# Patient Record
Sex: Male | Born: 1980 | Race: White | Hispanic: No | Marital: Single | Smoking: Never smoker
Health system: Southern US, Community
[De-identification: ages and names within clinical notes are randomized; demographics above are authoritative.]

## PROBLEM LIST (undated history)

## (undated) DIAGNOSIS — R001 Bradycardia, unspecified: Secondary | ICD-10-CM

## (undated) DIAGNOSIS — M94262 Chondromalacia, left knee: Secondary | ICD-10-CM

## (undated) DIAGNOSIS — K222 Esophageal obstruction: Secondary | ICD-10-CM

## (undated) DIAGNOSIS — M2342 Loose body in knee, left knee: Secondary | ICD-10-CM

## (undated) DIAGNOSIS — R29898 Other symptoms and signs involving the musculoskeletal system: Secondary | ICD-10-CM

## (undated) HISTORY — PX: ELBOW FRACTURE SURGERY: SHX616

## (undated) HISTORY — PX: WISDOM TOOTH EXTRACTION: SHX21

## (undated) HISTORY — PX: VASECTOMY: SHX75

---

## 1999-05-18 ENCOUNTER — Emergency Department (HOSPITAL_COMMUNITY): Admission: EM | Admit: 1999-05-18 | Discharge: 1999-05-18 | Payer: Self-pay | Admitting: Emergency Medicine

## 1999-05-18 ENCOUNTER — Encounter: Payer: Self-pay | Admitting: Emergency Medicine

## 2000-02-15 ENCOUNTER — Emergency Department (HOSPITAL_COMMUNITY): Admission: EM | Admit: 2000-02-15 | Discharge: 2000-02-15 | Payer: Self-pay | Admitting: Emergency Medicine

## 2003-03-27 ENCOUNTER — Ambulatory Visit (HOSPITAL_COMMUNITY): Admission: EM | Admit: 2003-03-27 | Discharge: 2003-03-28 | Payer: Self-pay | Admitting: Emergency Medicine

## 2004-06-19 ENCOUNTER — Inpatient Hospital Stay (HOSPITAL_COMMUNITY): Admission: EM | Admit: 2004-06-19 | Discharge: 2004-06-21 | Payer: Self-pay | Admitting: Psychiatry

## 2004-06-19 ENCOUNTER — Ambulatory Visit: Payer: Self-pay | Admitting: Psychiatry

## 2007-01-27 ENCOUNTER — Emergency Department (HOSPITAL_COMMUNITY): Admission: EM | Admit: 2007-01-27 | Discharge: 2007-01-27 | Payer: Self-pay | Admitting: Emergency Medicine

## 2010-09-22 NOTE — Op Note (Signed)
NAME:  Chad Harrison, HORNBACK NO.:  [id number]   MEDICAL RECORD NO.:  [id number]                   PATIENT TYPE:  EMS   LOCATION:  ED                                   FACILITY:  [facility]   PHYSICIAN:  Petra Kuba, M.D.                 DATE OF BIRTH:  07/15/1980   DATE OF PROCEDURE:  03/28/2003  DATE OF DISCHARGE:                                 OPERATIVE REPORT   PROCEDURE:  Esophagoscopy with food disimpaction.   INDICATION:  Probable food impaction.  Consent was signed after risks,  benefits, methods, options thoroughly discussed by the ER physicians at my  request and briefly by me prior to any sedation.   MEDICINES USED:  1. Demerol 60.  2. Versed 5.   DESCRIPTION OF PROCEDURE:  The video endoscope was inserted by direct  vision.  In the mid esophagus was a large food bolus.  We went ahead and  grabbed it with the snare, was suctioned onto the head of the scope, and the  scope then the snare and food were removed in one large piece.  We  reinserted the scope, and some liquid reflux was seen.  We did insert the  scope into the stomach and began suctioning fluid.  There was a small hiatal  hernia and a fibrous ring just above it but no significant trauma or other  abnormalities.  Unfortunately, while we were sucking the fluid, the patient  began to retch and gag, and we elected to stop the procedure at this  junction.  The scope was removed.  The patient tolerated the procedure  adequately.  There was no obvious immediate complication.   ENDOSCOPIC DIAGNOSES:  1. Food impaction, status post removal with the snare in one large piece.  2. Small to medium sized hiatal hernia with a proximal widely patent ring.  3. Stomach not seen due to increased gagging and retching while we were     suctioning fluid.   PLAN:  We will ask him to chew his food well, soft solids for a day.  Try  over-the-counter Prilosec and follow up in the office in two weeks to  decide  further work-up and plans like a possible barium swallow or proceeding with  an EGD and dilation.                                               Petra Kuba, M.D.    MEM/MEDQ  D:  03/28/2003  T:  03/28/2003  Job:  [id number]

## 2010-09-22 NOTE — Consult Note (Signed)
NAME:  Chad Harrison, Chad Harrison NO.:  [id number]   MEDICAL RECORD NO.:  [id number]                   PATIENT TYPE:  EMS   LOCATION:  ED                                   FACILITY:  [facility]   PHYSICIAN:  Petra Kuba, M.D.                 DATE OF BIRTH:  1980-11-03   DATE OF CONSULTATION:  03/28/2003  DATE OF DISCHARGE:                                   CONSULTATION   HISTORY:  The patient seen at the request of the ER physician for a food  impaction.  He has had episode of dysphagia but has always been able to get  it out.  He has not had any procedures, medicines, or work-up before.  But  tonight while eating steak, he felt it in his chest and was unable to  swallow any liquids.  ER physician did try some Glucagon which was  unsuccessful.  We are called for consideration of endoscopy.   PAST MEDICAL HISTORY:  Essentially negative.  He has had surgery on a broken  arm.   ALLERGIES:  CODEINE.   MEDICATIONS:  None.   SOCIAL HISTORY:  Does not drink or smoke.  Does not use any drugs.   FAMILY HISTORY:  Pertinent for his sister having similar swallowing problems  but no specific diagnosis.   REVIEW OF SYSTEMS:  Negative except above.   PHYSICAL EXAMINATION:  GENERAL:  No acute distress.  Spitting into the can.  LUNGS:  Clear.  HEART:  Regular rate and rhythm.  ABDOMEN:  Soft and nontender.   ASSESSMENT:  Probable food impaction.   PLAN:  The risks, benefits, methods of endoscopy were discussed by the  emergency room team, and we briefly discussed the procedure, and now are  going to it.  For further work-up and plans, please see endoscopy dictation.                                               Petra Kuba, M.D.    MEM/MEDQ  D:  03/28/2003  T:  03/28/2003  Job:  [id number]
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
## 2013-01-13 ENCOUNTER — Ambulatory Visit (INDEPENDENT_AMBULATORY_CARE_PROVIDER_SITE_OTHER): Payer: BC Managed Care – PPO | Admitting: Family Medicine

## 2013-01-13 VITALS — BP 112/60 | HR 64 | Temp 97.8°F | Resp 16 | Ht 71.5 in | Wt 170.8 lb

## 2013-01-13 DIAGNOSIS — S0990XA Unspecified injury of head, initial encounter: Secondary | ICD-10-CM

## 2013-01-13 DIAGNOSIS — D1720 Benign lipomatous neoplasm of skin and subcutaneous tissue of unspecified limb: Secondary | ICD-10-CM

## 2013-01-13 DIAGNOSIS — R55 Syncope and collapse: Secondary | ICD-10-CM

## 2013-01-13 NOTE — Patient Instructions (Addendum)
Take it easy for the next couple of days.  Rest and sleep as much as you can.  Avoid "screen time," reading and exercise.  If you are feeling well you can return to work on Thursday.  However, if you are not back to baseline by Thursday you may need more time.  Let us know if you need a change to your work note.  If you have vomiting, severe headache or any other symptoms that concern you please call us, come back in or go to the nearest ER.    At some point let's plan to check your blood count and blood sugar
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
## 2013-01-14 ENCOUNTER — Other Ambulatory Visit: Payer: Self-pay | Admitting: Radiology

## 2013-01-14 DIAGNOSIS — D1720 Benign lipomatous neoplasm of skin and subcutaneous tissue of unspecified limb: Secondary | ICD-10-CM

## 2013-01-19 ENCOUNTER — Ambulatory Visit
Admission: RE | Admit: 2013-01-19 | Discharge: 2013-01-19 | Disposition: A | Discharge status: Home / Self Care | Payer: BC Managed Care – PPO | Source: Ambulatory Visit | Attending: Family Medicine | Admitting: Family Medicine

## 2013-01-19 DIAGNOSIS — D1720 Benign lipomatous neoplasm of skin and subcutaneous tissue of unspecified limb: Secondary | ICD-10-CM

## 2013-01-20 ENCOUNTER — Telehealth: Payer: Self-pay | Admitting: Family Medicine

## 2013-01-20 NOTE — Telephone Encounter (Signed)
Called to give ultrasound results.  He has no further concerns.  Will plan to come in to go over some unrelated questions.

## 2013-06-29 ENCOUNTER — Emergency Department (HOSPITAL_COMMUNITY)
Admission: EM | Admit: 2013-06-29 | Discharge: 2013-06-30 | Disposition: A | Discharge status: Home / Self Care | Payer: BC Managed Care – PPO | Attending: Emergency Medicine | Admitting: Emergency Medicine

## 2013-06-29 ENCOUNTER — Encounter (HOSPITAL_COMMUNITY): Payer: Self-pay | Admitting: Emergency Medicine

## 2013-06-29 ENCOUNTER — Emergency Department (HOSPITAL_COMMUNITY): Payer: BC Managed Care – PPO

## 2013-06-29 DIAGNOSIS — Z8659 Personal history of other mental and behavioral disorders: Secondary | ICD-10-CM | POA: Insufficient documentation

## 2013-06-29 DIAGNOSIS — N451 Epididymitis: Secondary | ICD-10-CM

## 2013-06-29 DIAGNOSIS — Z8619 Personal history of other infectious and parasitic diseases: Secondary | ICD-10-CM | POA: Insufficient documentation

## 2013-06-29 DIAGNOSIS — N453 Epididymo-orchitis: Secondary | ICD-10-CM | POA: Insufficient documentation

## 2013-06-29 LAB — URINALYSIS, ROUTINE W REFLEX MICROSCOPIC
Bilirubin Urine: NEGATIVE
Glucose, UA: NEGATIVE mg/dL
Hgb urine dipstick: NEGATIVE
Ketones, ur: NEGATIVE mg/dL
Leukocytes, UA: NEGATIVE
Nitrite: NEGATIVE
Protein, ur: NEGATIVE mg/dL
Specific Gravity, Urine: 1.025 (ref 1.005–1.030)
Urobilinogen, UA: 0.2 mg/dL (ref 0.0–1.0)
pH: 5.5 (ref 5.0–8.0)

## 2013-06-29 NOTE — ED Notes (Signed)
Presents with right testicular pain described as intermittent and "twisting pain" pain radiates into abdomen as well. denies testicular swelling. Pain began at 6:30 this AM while getting into work truck. Denies injury to area.  Denies penile drainage. Denies sexual intercourse for "a long time"  Sent from [facility] to R/O torsion.

## 2013-06-29 NOTE — ED Notes (Signed)
Patient transported to Ultrasound 

## 2013-06-29 NOTE — ED Notes (Signed)
Patient returned from ultrasound at this time.

## 2013-06-29 NOTE — ED Provider Notes (Signed)
CSN: [id number]     Arrival date & time 06/29/13  2009 History   First MD Initiated Contact with Patient 06/29/13 2111     Chief Complaint  Patient presents with  . Testicle Pain     (Consider location/radiation/quality/duration/timing/severity/associated sxs/prior Treatment) Patient is a 33 y.o. male presenting with testicular pain. The history is provided by the patient.  Testicle Pain  He had onset this morning of a dull pain in the lower abdomen without any particular lateralization. Pain waxed and waned but became constant and settled in the right testicle sometime between 10 AM and 11 AM. Pain is moderate in intensity and is rated at 6/10. It is worse when standing if the testicle was not supported. Nothing makes it feel any better other than elevating the testicle. He denies any dysuria and denies any urethral discharge but does state that he was treated for chlamydia several weeks ago. He is status post vasectomy. He went to an urgent care Center where he was told to come to the ED to rule out testicular torsion.  Past Medical History  Diagnosis Date  . Depression    Past Surgical History  Procedure Laterality Date  . Elbow surgery  1986   Family History  Problem Relation Age of Onset  . Diabetes Mother   . Multiple sclerosis Father   . Anxiety disorder Sister   . Arthritis Maternal Grandmother   . Heart disease Paternal Grandfather    History  Substance Use Topics  . Smoking status: Never Smoker   . Smokeless tobacco: Not on file  . Alcohol Use: No    Review of Systems  Genitourinary: Positive for testicular pain.  All other systems reviewed and are negative.      Allergies  Wellbutrin and Codeine  Home Medications  No current outpatient prescriptions on file. BP 139/85  Pulse 66  Temp(Src) 98.2 F (36.8 C) (Oral)  Ht 5' 11.75" (1.822 m)  Wt 172 lb 4 oz (78.132 kg)  BMI 23.54 kg/m2  SpO2 100% Physical Exam  Nursing note and vitals reviewed.  33  year old male, resting comfortably and in no acute distress. Vital signs are normal. Oxygen saturation is 100%, which is normal. Head is normocephalic and atraumatic. PERRLA, EOMI. Oropharynx is clear. Neck is nontender and supple without adenopathy or JVD. Back is nontender and there is no CVA tenderness. Lungs are clear without rales, wheezes, or rhonchi. Chest is nontender. Heart has regular rate and rhythm without murmur. Abdomen is soft, flat, nontender without masses or hepatosplenomegaly and peristalsis is normoactive. Genitalia: Circumcised penis, testes descended without masses. Right testicle is slightly enlarged compared with the left. There is tenderness over the right epididymis but not in the right testicle. There is no tenderness in the inguinal canal. There is no inguinal adenopathy. Extremities have no cyanosis or edema, full range of motion is present. Skin is warm and dry without rash. Neurologic: Mental status is normal, cranial nerves are intact, there are no motor or sensory deficits.  ED Course  Procedures (including critical care time) Labs Review Results for orders placed during the hospital encounter of 06/29/13  URINALYSIS, ROUTINE W REFLEX MICROSCOPIC      Result Value Ref Range   Color, Urine YELLOW  YELLOW   APPearance CLEAR  CLEAR   Specific Gravity, Urine 1.025  1.005 - 1.030   pH 5.5  5.0 - 8.0   Glucose, UA NEGATIVE  NEGATIVE mg/dL   Hgb urine dipstick NEGATIVE  NEGATIVE  Bilirubin Urine NEGATIVE  NEGATIVE   Ketones, ur NEGATIVE  NEGATIVE mg/dL   Protein, ur NEGATIVE  NEGATIVE mg/dL   Urobilinogen, UA 0.2  0.0 - 1.0 mg/dL   Nitrite NEGATIVE  NEGATIVE   Leukocytes, UA NEGATIVE  NEGATIVE   Imaging Review US Scrotum  06/29/2013   CLINICAL DATA:  Right testicular pain.  EXAM: SCROTAL ULTRASOUND  DOPPLER ULTRASOUND OF THE TESTICLES  TECHNIQUE: Complete ultrasound examination of the testicles, epididymis, and other scrotal structures was performed. Color  and spectral Doppler ultrasound were also utilized to evaluate blood flow to the testicles.  COMPARISON:  None.  FINDINGS: Right testicle  Measurements: 4.0 x 2.6 x 4.0 cm. No mass or microlithiasis visualized.  Left testicle  Measurements: 4.7 x 3.0 x 3.3 cm. No mass or microlithiasis visualized.  Right epididymis: Enlarged, particularly at the inferior aspect of the epididymis, with mildly increased blood flow; the epididymis corresponds to the patient's site of pain, along the inferior aspect of the scrotum.  Left epididymis:  Normal in size and appearance.  Hydrocele:  None visualized.  Varicocele:  None visualized.  Pulsed Doppler interrogation of both testes demonstrates low resistance arterial and venous waveforms bilaterally. Mildly increased blood flow was noted with respect to the right testis.  IMPRESSION: Enlargement of the inferior aspect of the epididymis, with associated hyperemia and focal pain; mild hyperemia noted with respect to the right testis. Findings are compatible with right-sided epididymitis and mild orchitis. No evidence for testicular torsion.   Electronically Signed   By: Garald Balding M.D.   On: 06/29/2013 23:31   [place] Art/ven Flow Abd Pelv Doppler  06/29/2013   CLINICAL DATA:  Right testicular pain.  EXAM: SCROTAL ULTRASOUND  DOPPLER ULTRASOUND OF THE TESTICLES  TECHNIQUE: Complete ultrasound examination of the testicles, epididymis, and other scrotal structures was performed. Color and spectral Doppler ultrasound were also utilized to evaluate blood flow to the testicles.  COMPARISON:  None.  FINDINGS: Right testicle  Measurements: 4.0 x 2.6 x 4.0 cm. No mass or microlithiasis visualized.  Left testicle  Measurements: 4.7 x 3.0 x 3.3 cm. No mass or microlithiasis visualized.  Right epididymis: Enlarged, particularly at the inferior aspect of the epididymis, with mildly increased blood flow; the epididymis corresponds to the patient's site of pain, along the inferior aspect of the  scrotum.  Left epididymis:  Normal in size and appearance.  Hydrocele:  None visualized.  Varicocele:  None visualized.  Pulsed Doppler interrogation of both testes demonstrates low resistance arterial and venous waveforms bilaterally. Mildly increased blood flow was noted with respect to the right testis.  IMPRESSION: Enlargement of the inferior aspect of the epididymis, with associated hyperemia and focal pain; mild hyperemia noted with respect to the right testis. Findings are compatible with right-sided epididymitis and mild orchitis. No evidence for testicular torsion.   Electronically Signed   By: Garald Balding M.D.   On: 06/29/2013 23:31    MDM   Final diagnoses:  Epididymitis, right    Right testicular pain which seems most likely to be epididymitis. He did have a recent episode of Chlamydia. He'll be sent for ultrasound to rule out testicular torsion.  Ultrasound is consistent with epididymitis, no evidence of torsion. He is discharged with a prescription for doxycycline.  Delora Fuel, MD 13/24/40 1027

## 2013-06-30 MED ORDER — DOXYCYCLINE HYCLATE 100 MG PO TABS
100.0000 mg | ORAL_TABLET | Freq: Once | ORAL | Status: AC
  Administered 2013-06-30: 100 mg via ORAL
  Filled 2013-06-30: qty 1

## 2013-06-30 MED ORDER — DOXYCYCLINE HYCLATE 100 MG PO CAPS: 100.0000 mg | ORAL_CAPSULE | Freq: Two times a day (BID) | ORAL | Status: DC

## 2013-06-30 NOTE — Discharge Instructions (Signed)
Wear a jock strap as needed. Take two Naproxen (Aleve) tablets twice a day.  Epididymitis Epididymitis is a swelling (inflammation) of the epididymis. The epididymis is a cord-like structure along the back part of the testicle. Epididymitis is usually, but not always, caused by infection. This is usually a sudden problem beginning with chills, fever and pain behind the scrotum and in the testicle. There may be swelling and redness of the testicle. DIAGNOSIS  Physical examination will reveal a tender, swollen epididymis. Sometimes, cultures are obtained from the urine or from prostate secretions to help find out if there is an infection or if the cause is a different problem. Sometimes, blood work is performed to see if your white blood cell count is elevated and if a germ (bacterial) or viral infection is present. Using this knowledge, an appropriate medicine which kills germs (antibiotic) can be chosen by your caregiver. A viral infection causing epididymitis will most often go away (resolve) without treatment. HOME CARE INSTRUCTIONS   Hot sitz baths for 20 minutes, 4 times per day, may help relieve pain.  Only take over-the-counter or prescription medicines for pain, discomfort or fever as directed by your caregiver.  Take all medicines, including antibiotics, as directed. Take the antibiotics for the full prescribed length of time even if you are feeling better.  It is very important to keep all follow-up appointments. SEEK IMMEDIATE MEDICAL CARE IF:   You have a fever.  You have pain not relieved with medicines.  You have any worsening of your problems.  Your pain seems to come and go.  You develop pain, redness, and swelling in the scrotum and surrounding areas. MAKE SURE YOU:   Understand these instructions.  Will watch your condition.  Will get help right away if you are not doing well or get worse. Document Released: 04/20/2000 Document Revised: 07/16/2011 Document Reviewed:  03/10/2009 [facility] Patient Information 2014 [place], [place].  Doxycycline tablets or capsules What is this medicine? DOXYCYCLINE (dox i SYE kleen) is a tetracycline antibiotic. It kills certain bacteria or stops their growth. It is used to treat many kinds of infections, like dental, skin, respiratory, and urinary tract infections. It also treats acne, Lyme disease, malaria, and certain sexually transmitted infections. This medicine may be used for other purposes; ask your health care provider or pharmacist if you have questions. COMMON BRAND NAME(S): Adoxa CK, Adoxa Pak, Adoxa TT, Adoxa, Alodox, Avidoxy, Doxal, Monodox, Morgidox 1x Kit, Morgidox 1x, Morgidox 2x , Morgidox 2x Kit, Ocudox , Vibra-Tabs, Vibramycin What should I tell my health care provider before I take this medicine? They need to know if you have any of these conditions: -liver disease -long exposure to sunlight like working outdoors -stomach problems like colitis -an unusual or allergic reaction to doxycycline, tetracycline antibiotics, other medicines, foods, dyes, or preservatives -pregnant or trying to get pregnant -breast-feeding How should I use this medicine? Take this medicine by mouth with a full glass of water. Follow the directions on the prescription label. It is best to take this medicine without food, but if it upsets your stomach take it with food. Take your medicine at regular intervals. Do not take your medicine more often than directed. Take all of your medicine as directed even if you think you are better. Do not skip doses or stop your medicine early. Talk to your pediatrician regarding the use of this medicine in children. Special care may be needed. While this drug may be prescribed for children as young as 8  years old for selected conditions, precautions do apply. Overdosage: If you think you have taken too much of this medicine contact a poison control center or emergency room at once. NOTE: This medicine  is only for you. Do not share this medicine with others. What if I miss a dose? If you miss a dose, take it as soon as you can. If it is almost time for your next dose, take only that dose. Do not take double or extra doses. What may interact with this medicine? -antacids -barbiturates -birth control pills -bismuth subsalicylate -carbamazepine -methoxyflurane -other antibiotics -phenytoin -vitamins that contain iron -warfarin This list may not describe all possible interactions. Give your health care provider a list of all the medicines, herbs, non-prescription drugs, or dietary supplements you use. Also tell them if you smoke, drink alcohol, or use illegal drugs. Some items may interact with your medicine. What should I watch for while using this medicine? Tell your doctor or health care professional if your symptoms do not improve. Do not treat diarrhea with over the counter products. Contact your doctor if you have diarrhea that lasts more than 2 days or if it is severe and watery. Do not take this medicine just before going to bed. It may not dissolve properly when you lay down and can cause pain in your throat. Drink plenty of fluids while taking this medicine to also help reduce irritation in your throat. This medicine can make you more sensitive to the sun. Keep out of the sun. If you cannot avoid being in the sun, wear protective clothing and use sunscreen. Do not use sun lamps or tanning beds/booths. Birth control pills may not work properly while you are taking this medicine. Talk to your doctor about using an extra method of birth control. If you are being treated for a sexually transmitted infection, avoid sexual contact until you have finished your treatment. Your sexual partner may also need treatment. Avoid antacids, aluminum, calcium, magnesium, and iron products for 4 hours before and 2 hours after taking a dose of this medicine. If you are using this medicine to prevent  malaria, you should still protect yourself from contact with mosquitos. Stay in screened-in areas, use mosquito nets, keep your body covered, and use an insect repellent. What side effects may I notice from receiving this medicine? Side effects that you should report to your doctor or health care professional as soon as possible: -allergic reactions like skin rash, itching or hives, swelling of the face, lips, or tongue -difficulty breathing -fever -itching in the rectal or genital area -pain on swallowing -redness, blistering, peeling or loosening of the skin, including inside the mouth -severe stomach pain or cramps -unusual bleeding or bruising -unusually weak or tired -yellowing of the eyes or skin Side effects that usually do not require medical attention (report to your doctor or health care professional if they continue or are bothersome): -diarrhea -loss of appetite -nausea, vomiting This list may not describe all possible side effects. Call your doctor for medical advice about side effects. You may report side effects to FDA at 1-800-FDA-1088. Where should I keep my medicine? Keep out of the reach of children. Store at room temperature, below 30 degrees C (86 degrees F). Protect from light. Keep container tightly closed. Throw away any unused medicine after the expiration date. Taking this medicine after the expiration date can make you seriously ill. NOTE: This sheet is a summary. It may not cover all possible information. If you have  questions about this medicine, talk to your doctor, pharmacist, or health care provider.  2014, [employer]vier/[employer] Standard. (2007-08-12 16:53:02)

## 2015-02-05 DIAGNOSIS — M2342 Loose body in knee, left knee: Secondary | ICD-10-CM

## 2015-02-05 DIAGNOSIS — M94262 Chondromalacia, left knee: Secondary | ICD-10-CM

## 2015-02-05 HISTORY — DX: Chondromalacia, left knee: M94.262

## 2015-02-05 HISTORY — DX: Loose body in knee, left knee: M23.42

## 2015-02-11 ENCOUNTER — Other Ambulatory Visit: Payer: Self-pay | Admitting: Orthopedic Surgery

## 2015-02-14 ENCOUNTER — Encounter (HOSPITAL_BASED_OUTPATIENT_CLINIC_OR_DEPARTMENT_OTHER): Payer: Self-pay | Admitting: *Deleted

## 2015-02-15 NOTE — H&P (Signed)
Chad Harrison is an 34 y.o. male.    Chief Complaint: Left Knee Pain  HPI: Chad Harrison is here today for follow up of left knee pain.  He has been lifting a lot at work.  He still is experiencing intermittent dull pain that migrates around the medial aspect of his knee.  His pain has been going on for roughly 2-3 weeks at about 2/10.  He is experiencing burning sensation or warmth over the medial side of his knee intermittently.  He states his knee feels unstable and he has been limping on and off.  He denies swelling this pain does not wake him at night.  He feels that he is unable to run or jog due to the instability and pain.  He has been taking his Mobic every day for one week which he thinks is helping.  He denies numbness or tingling or weakness in his lower extremity.  Past Medical History  Diagnosis Date  . Loose body of left knee 02/2015  . 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 Slow heartbeat     normal for him, per pt.  . Schatzki's ring     states chokes on food easily  . Jaw clicking     Past Surgical History  Procedure Laterality Date  . Wisdom tooth extraction    . Vasectomy    . Elbow fracture surgery      as a child    Family History  Problem Relation Age of Onset  . Diabetes Mother   . Multiple sclerosis Father   . Anxiety disorder Sister   . Arthritis Maternal Grandmother   . Heart disease Paternal Grandfather    Social History:  reports that he has never smoked. He has never used smokeless tobacco. He reports that he drinks alcohol. He reports that he does not use illicit drugs.  Allergies:  Allergies  Allergen Reactions  . Wellbutrin [Bupropion] Hives and Shortness Of Breath  . Codeine Other (See Comments)    PARALYZES LEGS    No prescriptions prior to admission    No results found for this or any previous visit (from the past 48 hour(s)). No results found.  Review of Systems  Constitutional: Negative.   HENT: Negative.   Eyes: Negative.    Respiratory: Negative.   Cardiovascular: Negative.   Gastrointestinal: Negative.   Genitourinary: Negative.   Musculoskeletal: Positive for joint pain.  Skin: Negative.   Neurological: Negative.   Endo/Heme/Allergies: Negative.   Psychiatric/Behavioral: Negative.     Height 5' 11.75" (1.822 m), weight 80.287 kg (177 lb). Physical Exam  Constitutional: He is oriented to person, place, and time. He appears well-developed and well-nourished.  HENT:  Head: Normocephalic and atraumatic.  Eyes: Pupils are equal, round, and reactive to light.  Neck: Normal range of motion. Neck supple.  Cardiovascular: Intact distal pulses.   Respiratory: Effort normal.  Musculoskeletal:  The patient is left knee revealed no effusion and no ecchymosis and no deformities.  He is nontender to palpation and he has excellent range of motion roughly 0 to 130.  He has no crepitus.    Neurological: He is alert and oriented to person, place, and time.  Skin: Skin is warm and dry.  Psychiatric: He has a normal mood and affect. His behavior is normal. Judgment and thought content normal.     Assessment/Plan Assessment:   Symptomatic chondromalacia of the left knee after a work-related injury in November of 2015  Plan:  Treatment options are  discussed with the patient.  This patient is also discussed with Dr. Mayer Camel who examines the patient.  His MRI does show that he has a loose body.  He is agreed and consented to a knee arthroscopy for removal of the loose body.  The benefits risks and potential complications of surgery are discussed with the patient.  We will formally ask Worker's Comp. for approval to remove the loose body.  We anticipate seeing the patient back at time of surgical intervention.  Continue with current work status.  Call with any issues.  Keily Lepp R 02/15/2015, 12:26 PM

## 2015-02-16 ENCOUNTER — Ambulatory Visit (HOSPITAL_BASED_OUTPATIENT_CLINIC_OR_DEPARTMENT_OTHER): Payer: Worker's Compensation | Admitting: Anesthesiology

## 2015-02-16 ENCOUNTER — Ambulatory Visit (HOSPITAL_BASED_OUTPATIENT_CLINIC_OR_DEPARTMENT_OTHER)
Admission: RE | Admit: 2015-02-16 | Discharge: 2015-02-17 | Disposition: A | Discharge status: Home / Self Care | Payer: Worker's Compensation | Source: Ambulatory Visit | Attending: Orthopedic Surgery | Admitting: Orthopedic Surgery

## 2015-02-16 ENCOUNTER — Encounter (HOSPITAL_BASED_OUTPATIENT_CLINIC_OR_DEPARTMENT_OTHER)
Admission: RE | Disposition: A | Discharge status: Home / Self Care | Payer: Self-pay | Source: Ambulatory Visit | Attending: Orthopedic Surgery

## 2015-02-16 ENCOUNTER — Encounter (HOSPITAL_BASED_OUTPATIENT_CLINIC_OR_DEPARTMENT_OTHER): Payer: Self-pay | Admitting: *Deleted

## 2015-02-16 DIAGNOSIS — W228XXA Striking against or struck by other objects, initial encounter: Secondary | ICD-10-CM | POA: Diagnosis not present

## 2015-02-16 DIAGNOSIS — Y990 Civilian activity done for income or pay: Secondary | ICD-10-CM | POA: Diagnosis not present

## 2015-02-16 DIAGNOSIS — M6752 Plica syndrome, left knee: Secondary | ICD-10-CM | POA: Insufficient documentation

## 2015-02-16 DIAGNOSIS — M6750 Plica syndrome, unspecified knee: Secondary | ICD-10-CM | POA: Diagnosis present

## 2015-02-16 DIAGNOSIS — M2342 Loose body in knee, left knee: Secondary | ICD-10-CM | POA: Diagnosis present

## 2015-02-16 HISTORY — PX: KNEE ARTHROSCOPY WITH EXCISION PLICA: SHX5647

## 2015-02-16 HISTORY — DX: Bradycardia, unspecified: R00.1

## 2015-02-16 HISTORY — DX: Other symptoms and signs involving the musculoskeletal system: R29.898

## 2015-02-16 HISTORY — DX: Chondromalacia, left knee: M94.262

## 2015-02-16 HISTORY — DX: Esophageal obstruction: K22.2

## 2015-02-16 HISTORY — DX: Loose body in knee, left knee: M23.42

## 2015-02-16 SURGERY — ARTHROSCOPY, KNEE, WITH PLICA EXCISION
Anesthesia: General | Site: Knee | Laterality: Left

## 2015-02-16 MED ORDER — MIDAZOLAM HCL 2 MG/2ML IJ SOLN
1.0000 mg | INTRAMUSCULAR | Status: DC | PRN
  Administered 2015-02-16 (×2): 2 mg via INTRAVENOUS

## 2015-02-16 MED ORDER — ONDANSETRON HCL 4 MG/2ML IJ SOLN: 4.0000 mg | Freq: Four times a day (QID) | INTRAMUSCULAR | Status: DC | PRN

## 2015-02-16 MED ORDER — ONDANSETRON HCL 4 MG/2ML IJ SOLN
INTRAMUSCULAR | Status: DC | PRN
  Administered 2015-02-16: 4 mg via INTRAVENOUS

## 2015-02-16 MED ORDER — FENTANYL CITRATE (PF) 100 MCG/2ML IJ SOLN
50.0000 ug | INTRAMUSCULAR | Status: DC | PRN
  Administered 2015-02-16 (×2): 100 ug via INTRAVENOUS

## 2015-02-16 MED ORDER — SODIUM CHLORIDE 0.9 % IR SOLN
Status: DC | PRN
  Administered 2015-02-16: 2000 mL

## 2015-02-16 MED ORDER — LIDOCAINE HCL (CARDIAC) 20 MG/ML IV SOLN
INTRAVENOUS | Status: DC | PRN
  Administered 2015-02-16: 60 mg via INTRAVENOUS

## 2015-02-16 MED ORDER — FENTANYL CITRATE (PF) 100 MCG/2ML IJ SOLN
INTRAMUSCULAR | Status: AC
  Filled 2015-02-16: qty 2

## 2015-02-16 MED ORDER — ACETAMINOPHEN 325 MG PO TABS
650.0000 mg | ORAL_TABLET | Freq: Four times a day (QID) | ORAL | Status: DC
  Administered 2015-02-16 – 2015-02-17 (×2): 650 mg via ORAL
  Filled 2015-02-16 (×2): qty 2

## 2015-02-16 MED ORDER — DEXAMETHASONE SODIUM PHOSPHATE 10 MG/ML IJ SOLN
INTRAMUSCULAR | Status: AC
  Filled 2015-02-16: qty 1

## 2015-02-16 MED ORDER — ONDANSETRON HCL 4 MG/2ML IJ SOLN: 4.0000 mg | Freq: Once | INTRAMUSCULAR | Status: DC | PRN

## 2015-02-16 MED ORDER — CHLORHEXIDINE GLUCONATE 4 % EX LIQD: 60.0000 mL | Freq: Once | CUTANEOUS | Status: DC

## 2015-02-16 MED ORDER — ONDANSETRON HCL 4 MG PO TABS: 4.0000 mg | ORAL_TABLET | Freq: Four times a day (QID) | ORAL | Status: DC | PRN

## 2015-02-16 MED ORDER — TRAMADOL HCL 50 MG PO TABS: 50.0000 mg | ORAL_TABLET | Freq: Four times a day (QID) | ORAL | Status: DC | PRN

## 2015-02-16 MED ORDER — LACTATED RINGERS IV SOLN
INTRAVENOUS | Status: DC
  Administered 2015-02-16: 12:00:00 via INTRAVENOUS
  Administered 2015-02-16: 10 mL/h via INTRAVENOUS

## 2015-02-16 MED ORDER — LIDOCAINE HCL (CARDIAC) 20 MG/ML IV SOLN
INTRAVENOUS | Status: AC
  Filled 2015-02-16: qty 5

## 2015-02-16 MED ORDER — DEXAMETHASONE SODIUM PHOSPHATE 4 MG/ML IJ SOLN
INTRAMUSCULAR | Status: DC | PRN
  Administered 2015-02-16: 10 mg via INTRAVENOUS

## 2015-02-16 MED ORDER — PROPOFOL 10 MG/ML IV BOLUS
INTRAVENOUS | Status: AC
  Filled 2015-02-16: qty 20

## 2015-02-16 MED ORDER — ONDANSETRON HCL 4 MG/2ML IJ SOLN
INTRAMUSCULAR | Status: AC
  Filled 2015-02-16: qty 2

## 2015-02-16 MED ORDER — HYDROCODONE-ACETAMINOPHEN 5-325 MG PO TABS: 1.0000 | ORAL_TABLET | ORAL | Status: DC | PRN

## 2015-02-16 MED ORDER — FENTANYL CITRATE (PF) 100 MCG/2ML IJ SOLN: 25.0000 ug | INTRAMUSCULAR | Status: DC | PRN

## 2015-02-16 MED ORDER — HYDROCODONE-ACETAMINOPHEN 5-325 MG PO TABS: 1.0000 | ORAL_TABLET | Freq: Four times a day (QID) | ORAL | Status: DC | PRN

## 2015-02-16 MED ORDER — CEFAZOLIN SODIUM-DEXTROSE 2-3 GM-% IV SOLR
INTRAVENOUS | Status: AC
  Filled 2015-02-16: qty 50

## 2015-02-16 MED ORDER — METOCLOPRAMIDE HCL 5 MG PO TABS: 5.0000 mg | ORAL_TABLET | Freq: Three times a day (TID) | ORAL | Status: DC | PRN

## 2015-02-16 MED ORDER — MORPHINE SULFATE (PF) 2 MG/ML IV SOLN: 0.5000 mg | INTRAVENOUS | Status: DC | PRN

## 2015-02-16 MED ORDER — PROPOFOL 10 MG/ML IV BOLUS
INTRAVENOUS | Status: DC | PRN
  Administered 2015-02-16: 200 mg via INTRAVENOUS

## 2015-02-16 MED ORDER — SCOPOLAMINE 1 MG/3DAYS TD PT72: 1.0000 | MEDICATED_PATCH | Freq: Once | TRANSDERMAL | Status: DC | PRN

## 2015-02-16 MED ORDER — DEXTROSE-NACL 5-0.45 % IV SOLN: INTRAVENOUS | Status: DC

## 2015-02-16 MED ORDER — MIDAZOLAM HCL 2 MG/2ML IJ SOLN
INTRAMUSCULAR | Status: AC
  Filled 2015-02-16: qty 4

## 2015-02-16 MED ORDER — MIDAZOLAM HCL 2 MG/2ML IJ SOLN
INTRAMUSCULAR | Status: AC
  Filled 2015-02-16: qty 2

## 2015-02-16 MED ORDER — GLYCOPYRROLATE 0.2 MG/ML IJ SOLN: 0.2000 mg | Freq: Once | INTRAMUSCULAR | Status: DC | PRN

## 2015-02-16 MED ORDER — METOCLOPRAMIDE HCL 5 MG/ML IJ SOLN: 5.0000 mg | Freq: Three times a day (TID) | INTRAMUSCULAR | Status: DC | PRN

## 2015-02-16 MED ORDER — FENTANYL CITRATE (PF) 100 MCG/2ML IJ SOLN
INTRAMUSCULAR | Status: AC
  Filled 2015-02-16: qty 4

## 2015-02-16 MED ORDER — CEFAZOLIN SODIUM-DEXTROSE 2-3 GM-% IV SOLR: 2.0000 g | INTRAVENOUS | Status: DC

## 2015-02-16 SURGICAL SUPPLY — 29 items
BANDAGE ELASTIC 6 VELCRO ST LF (GAUZE/BANDAGES/DRESSINGS) ×3 IMPLANT
BLADE CUDA GRT WHITE 3.5 (BLADE) ×3 IMPLANT
BNDG COHESIVE 6X5 TAN STRL LF (GAUZE/BANDAGES/DRESSINGS) ×3 IMPLANT
DRAPE ARTHROSCOPY W/POUCH 114 (DRAPES) ×3 IMPLANT
DURAPREP 26ML APPLICATOR (WOUND CARE) ×3 IMPLANT
GAUZE SPONGE 4X4 12PLY STRL (GAUZE/BANDAGES/DRESSINGS) ×3 IMPLANT
GAUZE XEROFORM 1X8 LF (GAUZE/BANDAGES/DRESSINGS) ×3 IMPLANT
GLOVE BIO SURGEON STRL SZ 6.5 (GLOVE) ×2 IMPLANT
GLOVE BIO SURGEON STRL SZ7.5 (GLOVE) ×3 IMPLANT
GLOVE BIO SURGEON STRL SZ8.5 (GLOVE) ×3 IMPLANT
GLOVE BIO SURGEONS STRL SZ 6.5 (GLOVE) ×1
GLOVE BIOGEL PI IND STRL 7.0 (GLOVE) ×1 IMPLANT
GLOVE BIOGEL PI IND STRL 8 (GLOVE) ×1 IMPLANT
GLOVE BIOGEL PI IND STRL 9 (GLOVE) ×1 IMPLANT
GLOVE BIOGEL PI INDICATOR 7.0 (GLOVE) ×2
GLOVE BIOGEL PI INDICATOR 8 (GLOVE) ×2
GLOVE BIOGEL PI INDICATOR 9 (GLOVE) ×2
GOWN STRL REUS W/ TWL LRG LVL3 (GOWN DISPOSABLE) ×2 IMPLANT
GOWN STRL REUS W/TWL LRG LVL3 (GOWN DISPOSABLE) ×4
GOWN STRL REUS W/TWL XL LVL3 (GOWN DISPOSABLE) ×3 IMPLANT
IV NS IRRIG 3000ML ARTHROMATIC (IV SOLUTION) ×3 IMPLANT
KNEE WRAP E Z 3 GEL PACK (MISCELLANEOUS) ×3 IMPLANT
MANIFOLD NEPTUNE II (INSTRUMENTS) ×3 IMPLANT
PACK ARTHROSCOPY DSU (CUSTOM PROCEDURE TRAY) ×3 IMPLANT
PACK BASIN DAY SURGERY FS (CUSTOM PROCEDURE TRAY) ×3 IMPLANT
SET ARTHROSCOPY TUBING (MISCELLANEOUS) ×2
SET ARTHROSCOPY TUBING LN (MISCELLANEOUS) ×1 IMPLANT
TOWEL OR 17X24 6PK STRL BLUE (TOWEL DISPOSABLE) ×3 IMPLANT
WATER STERILE IRR 1000ML POUR (IV SOLUTION) ×3 IMPLANT

## 2015-02-16 NOTE — Transfer of Care (Signed)
Immediate Anesthesia Transfer of Care Note  Patient: Chad Harrison  Procedure(s) Performed: Procedure(s): ARTHROSCOPY LEFT KNEE with excision of plica (Left)  Patient Location: PACU  Anesthesia Type:GA combined with regional for post-op pain  Level of Consciousness: awake, sedated and patient cooperative  Airway & Oxygen Therapy: Patient Spontanous Breathing and Patient connected to face mask oxygen  Post-op Assessment: Report given to RN and Post -op Vital signs reviewed and stable  Post vital signs: Reviewed and stable  Last Vitals:  Filed Vitals:   02/16/15 1115  BP: 119/70  Pulse: 59  Temp:   Resp: 12    Complications: No apparent anesthesia complications

## 2015-02-16 NOTE — Anesthesia Procedure Notes (Addendum)
Anesthesia Regional Block:  Adductor canal block  Pre-Anesthetic Checklist: ,, timeout performed, Correct Patient, Correct Site, Correct Laterality, Correct Procedure, Correct Position, site marked, Risks and benefits discussed,  Surgical consent,  Pre-op evaluation,  At surgeon's request and post-op pain management  Laterality: Left  Prep: chloraprep       Needles:   Needle Type: Echogenic Stimulator Needle     Needle Length: 9cm 9 cm Needle Gauge: 22 and 22 G    Additional Needles:  Procedures: ultrasound guided (picture in chart) Adductor canal block Narrative:  Start time: 02/16/2015 11:20 AM End time: 02/16/2015 11:25 AM Injection made incrementally with aspirations every 5 mL.  Performed by: Personally   Additional Notes: 30 cc 0.5% Bupivacaine  With 1:200 Epi injected easily   Procedure Name: LMA Insertion Date/Time: 02/16/2015 12:05 PM Performed by: Lyndee Leo Pre-anesthesia Checklist: Patient identified, Emergency Drugs available, Suction available and Patient being monitored Patient Re-evaluated:Patient Re-evaluated prior to inductionOxygen Delivery Method: Circle System Utilized Preoxygenation: Pre-oxygenation with 100% oxygen Intubation Type: IV induction Ventilation: Mask ventilation without difficulty LMA: LMA inserted LMA Size: 4.0 Number of attempts: 1 Airway Equipment and Method: Bite block Placement Confirmation: positive ETCO2 Tube secured with: Tape Dental Injury: Teeth and Oropharynx as per pre-operative assessment

## 2015-02-16 NOTE — Anesthesia Preprocedure Evaluation (Signed)
Anesthesia Evaluation  Patient identified by MRN, date of birth, ID band Patient awake    Reviewed: Allergy & Precautions, NPO status , Patient's Chart, lab work & pertinent test results  Airway Mallampati: II  TM Distance: >3 FB Neck ROM: Full    Dental  (+) Teeth Intact, Dental Advisory Given   Pulmonary    breath sounds clear to auscultation       Cardiovascular  Rhythm:Regular     Neuro/Psych    GI/Hepatic   Endo/Other    Renal/GU      Musculoskeletal   Abdominal   Peds  Hematology   Anesthesia Other Findings   Reproductive/Obstetrics                             Anesthesia Physical Anesthesia Plan  ASA: I  Anesthesia Plan: General   Post-op Pain Management:    Induction: Intravenous  Airway Management Planned: LMA  Additional Equipment:   Intra-op Plan:   Post-operative Plan:   Informed Consent: I have reviewed the patients History and Physical, chart, labs and discussed the procedure including the risks, benefits and alternatives for the proposed anesthesia with the patient or authorized representative who has indicated his/her understanding and acceptance.   Dental advisory given  Plan Discussed with: CRNA and Anesthesiologist  Anesthesia Plan Comments: (Plan GA with LMA and Adductor canal block)        Anesthesia Quick Evaluation

## 2015-02-16 NOTE — Op Note (Signed)
Pre-Op Dx: Left knee loose body  Postop Dx: Left knee inflamed plica   Procedure: Left knee arthroscopic removal of inflamed plica  Surgeon: Kathalene Frames. Mayer Camel M.D.  Assist: Kerry Hough. Barton Dubois  (present throughout entire procedure and necessary for timely completion of the procedure) Anes: General LMA  EBL: Minimal  Fluids: 800 cc   Indications: Patient injured at work with direct blow to inferior patella persistent pain MRI scan consistent with possible loose body. Pt has failed conservative treatment with anti-inflammatory medicines, physical therapy, and modified activites but did get good temporarily from an intra-articular cortisone injection. Pain has recurred and patient desires elective arthroscopic evaluation and treatment of knee. Risks and benefits of surgery have been discussed and questions answered.  Procedure: Patient identified by arm band and taken to the operating room at the day surgery Center. The appropriate anesthetic monitors were attached, and General LMA anesthesia was induced without difficulty. Lateral post was applied to the table and the lower extremity was prepped and draped in usual sterile fashion from the ankle to the midthigh. Time out procedure was performed. We began the operation by making standard inferior lateral and inferior medial peripatellar portals with a #11 blade allowing introduction of the arthroscope through the inferior lateral portal and the out flow to the inferior medial portal. Pump pressure was set at 100 mmHg and diagnostic arthroscopy  revealed inflamed plica that was removed with a 3.5 mm gray-white sucker shaver the rest of the patellofemoral joint, medial and lateral compartment, and gutters were clear. We removed the ligamentum flavum and did not encounter any loose bodies near the PCL origin or insertion were able to visualize the posterior horns of both menisci bypassing the scope medial and lateral to the PCL and did not encounter any loose  bodies there either. The only abnormal finding was inflamed plica.. The knee was irrigated out normal saline solution. A dressing of xerofoam 4 x 4 dressing sponges, web roll and an Ace wrap was applied. The patient was awakened extubated and taken to the recovery without difficulty.    Signed: Kerin Salen, MD

## 2015-02-16 NOTE — Discharge Instructions (Signed)
Regional Anesthesia Blocks  1. Numbness or the inability to move the "blocked" extremity may last from 3-48 hours after placement. The length of time depends on the medication injected and your individual response to the medication. If the numbness is not going away after 48 hours, call your surgeon.  2. The extremity that is blocked will need to be protected until the numbness is gone and the  Strength has returned. Because you cannot feel it, you will need to take extra care to avoid injury. Because it may be weak, you may have difficulty moving it or using it. You may not know what position it is in without looking at it while the block is in effect.  3. For blocks in the legs and feet, returning to weight bearing and walking needs to be done carefully. You will need to wait until the numbness is entirely gone and the strength has returned. You should be able to move your leg and foot normally before you try and bear weight or walk. You will need someone to be with you when you first try to ensure you do not fall and possibly risk injury.  4. Bruising and tenderness at the needle site are common side effects and will resolve in a few days.  5. Persistent numbness or new problems with movement should be communicated to the surgeon or the [facility] ([phone])/ [facility] ([phone]). 

## 2015-02-16 NOTE — Progress Notes (Signed)
Assisted Dr. Joslin with left, ultrasound guided, femoral block. Side rails up, monitors on throughout procedure. See vital signs in flow sheet. Tolerated Procedure well. 

## 2015-02-16 NOTE — Anesthesia Postprocedure Evaluation (Signed)
  Anesthesia Post-op Note  Patient: Chad Harrison  Procedure(s) Performed: Procedure(s): ARTHROSCOPY LEFT KNEE WITH EXCISION PLICA (Left)  Patient Location: PACU  Anesthesia Type:General  Level of Consciousness: awake, alert  and oriented  Airway and Oxygen Therapy: Patient Spontanous Breathing and Patient connected to nasal cannula oxygen  Post-op Pain: mild  Post-op Assessment: Post-op Vital signs reviewed, Patient's Cardiovascular Status Stable, Respiratory Function Stable, Patent Airway and Pain level controlled LLE Motor Response: Purposeful movement LLE Sensation: Full sensation          Post-op Vital Signs: stable  Last Vitals:  Filed Vitals:   02/16/15 1341  BP: 117/73  Pulse: 55  Temp: 36.2 C  Resp: 16    Complications: No apparent anesthesia complications

## 2015-02-16 NOTE — Interval H&P Note (Signed)
History and Physical Interval Note:  02/16/2015 11:34 AM  Chad Harrison  has presented today for surgery, with the diagnosis of left knee loose body, chondromalacia  The various methods of treatment have been discussed with the patient and family. After consideration of risks, benefits and other options for treatment, the patient has consented to  Procedure(s): ARTHROSCOPY LEFT KNEE (Left) as a surgical intervention .  The patient's history has been reviewed, patient examined, no change in status, stable for surgery.  I have reviewed the patient's chart and labs.  Questions were answered to the patient's satisfaction.     Kerin Salen

## 2015-02-16 NOTE — Addendum Note (Signed)
Addendum  created 02/16/15 1542 by Lyndee Leo, CRNA   Modules edited: Anesthesia Events, Narrator, Narrator Events   Narrator:  Narrator: Event Log Edited   Narrator Events:  Delete Handoff event

## 2015-02-17 ENCOUNTER — Encounter (HOSPITAL_BASED_OUTPATIENT_CLINIC_OR_DEPARTMENT_OTHER): Payer: Self-pay | Admitting: Orthopedic Surgery

## 2015-05-25 ENCOUNTER — Ambulatory Visit (INDEPENDENT_AMBULATORY_CARE_PROVIDER_SITE_OTHER): Payer: BLUE CROSS/BLUE SHIELD | Admitting: Physician Assistant

## 2015-05-25 VITALS — BP 112/70 | HR 66 | Temp 98.2°F | Resp 16 | Ht 73.0 in | Wt 192.8 lb

## 2015-05-25 DIAGNOSIS — J069 Acute upper respiratory infection, unspecified: Secondary | ICD-10-CM | POA: Diagnosis not present

## 2015-05-25 DIAGNOSIS — B9789 Other viral agents as the cause of diseases classified elsewhere: Principal | ICD-10-CM

## 2015-05-25 MED ORDER — BENZONATATE 100 MG PO CAPS: 100.0000 mg | ORAL_CAPSULE | Freq: Three times a day (TID) | ORAL | Status: DC | PRN

## 2015-05-25 NOTE — Patient Instructions (Signed)
Please return for the flu vaccine once you are well.  Upper Respiratory Infection, Adult Most upper respiratory infections (URIs) are a viral infection of the air passages leading to the lungs. A URI affects the nose, throat, and upper air passages. The most common type of URI is nasopharyngitis and is typically referred to as "the common cold." URIs run their course and usually go away on their own. Most of the time, a URI does not require medical attention, but sometimes a bacterial infection in the upper airways can follow a viral infection. This is called a secondary infection. Sinus and middle ear infections are common types of secondary upper respiratory infections. Bacterial pneumonia can also complicate a URI. A URI can worsen asthma and chronic obstructive pulmonary disease (COPD). Sometimes, these complications can require emergency medical care and may be life threatening.  CAUSES Almost all URIs are caused by viruses. A virus is a type of germ and can spread from one person to another.  RISKS FACTORS You may be at risk for a URI if:   You smoke.   You have chronic heart or lung disease.  You have a weakened defense (immune) system.   You are very young or very old.   You have nasal allergies or asthma.  You work in crowded or poorly ventilated areas.  You work in health care facilities or schools. SIGNS AND SYMPTOMS  Symptoms typically develop 2-3 days after you come in contact with a cold virus. Most viral URIs last 7-10 days. However, viral URIs from the influenza virus (flu virus) can last 14-18 days and are typically more severe. Symptoms may include:   Runny or stuffy (congested) nose.   Sneezing.   Cough.   Sore throat.   Headache.   Fatigue.   Fever.   Loss of appetite.   Pain in your forehead, behind your eyes, and over your cheekbones (sinus pain).  Muscle aches.  DIAGNOSIS  Your health care provider may diagnose a URI by:  Physical  exam.  Tests to check that your symptoms are not due to another condition such as:  Strep throat.  Sinusitis.  Pneumonia.  Asthma. TREATMENT  A URI goes away on its own with time. It cannot be cured with medicines, but medicines may be prescribed or recommended to relieve symptoms. Medicines may help:  Reduce your fever.  Reduce your cough.  Relieve nasal congestion. HOME CARE INSTRUCTIONS   Take medicines only as directed by your health care provider.   Gargle warm saltwater or take cough drops to comfort your throat as directed by your health care provider.  Use a warm mist humidifier or inhale steam from a shower to increase air moisture. This may make it easier to breathe.  Drink enough fluid to keep your urine clear or pale yellow.   Eat soups and other clear broths and maintain good nutrition.   Rest as needed.   Return to work when your temperature has returned to normal or as your health care provider advises. You may need to stay home longer to avoid infecting others. You can also use a face mask and careful hand washing to prevent spread of the virus.  Increase the usage of your inhaler if you have asthma.   Do not use any tobacco products, including cigarettes, chewing tobacco, or electronic cigarettes. If you need help quitting, ask your health care provider. PREVENTION  The best way to protect yourself from getting a cold is to practice good hygiene.  Avoid oral or hand contact with people with cold symptoms.   Wash your hands often if contact occurs.  There is no clear evidence that vitamin C, vitamin E, echinacea, or exercise reduces the chance of developing a cold. However, it is always recommended to get plenty of rest, exercise, and practice good nutrition.  SEEK MEDICAL CARE IF:   You are getting worse rather than better.   Your symptoms are not controlled by medicine.   You have chills.  You have worsening shortness of breath.  You  have brown or red mucus.  You have yellow or brown nasal discharge.  You have pain in your face, especially when you bend forward.  You have a fever.  You have swollen neck glands.  You have pain while swallowing.  You have white areas in the back of your throat. SEEK IMMEDIATE MEDICAL CARE IF:   You have severe or persistent:  Headache.  Ear pain.  Sinus pain.  Chest pain.  You have chronic lung disease and any of the following:  Wheezing.  Prolonged cough.  Coughing up blood.  A change in your usual mucus.  You have a stiff neck.  You have changes in your:  Vision.  Hearing.  Thinking.  Mood. MAKE SURE YOU:   Understand these instructions.  Will watch your condition.  Will get help right away if you are not doing well or get worse.   This information is not intended to replace advice given to you by your health care provider. Make sure you discuss any questions you have with your health care provider.   Document Released: 10/17/2000 Document Revised: 09/07/2014 Document Reviewed: 07/29/2013 Elsevier Interactive Patient Education [employer].

## 2015-05-25 NOTE — Progress Notes (Signed)
Patient ID: Chad Harrison, male    DOB: 04/08/1981, 35 y.o.   MRN: [id number]  PCP: No PCP Per Patient  Subjective:   Chief Complaint  Patient presents with  . Fatigue    x 5 days  . Headache  . Cough  . Flu Vaccine    HPI Presents for evaluation of fatigue, cough, headache.  "I just want to make sure I just have a cold. I've been exposed to a lot of people with bronchitis and laryngitis, and this is just hanging around a while." His 71 year old daughter was recently diagnosed with H pylori infection.  Late Friday night/Saturday (1/14) morning developed a hoarse voice. Thought it was just due to staying up too late, but the next day had fever. He broke out in a sweat, feeling like his fever broke, and hasn't had any subjective fever/chills since then. Then headache (mild), fatigue, lethargic, cough began on 1/16. Cough is progressively worsening, non-productive. Coughing is keeping him awake. No body aches. Occasional cough drops, without relief, alka-seltzer helps. Tried to go to work, but isn't able to stay longer than 2 hours, due to fatigue. No diarrhea. Occasional nausea, no vomiting, though he has coughed so hard that he gagged.  He is currently on modified duty/reduced hours, recovering from knee surgery.    Review of Systems As above.    Patient Active Problem List   Diagnosis Date Noted  . Plica of knee 0000000     Prior to Admission medications   Not on File     Allergies  Allergen Reactions  . Wellbutrin [Bupropion] Hives and Shortness Of Breath  . Codeine Other (See Comments)    Other reaction(s): Other Caused him to be able to walk PARALYZES LEGS       Objective:  Physical Exam  Constitutional: He is oriented to person, place, and time. Vital signs are normal. He appears well-developed and well-nourished. No distress.  BP 112/70 mmHg  Pulse 66  Temp(Src) 98.2 F (36.8 C)  Resp 16  Ht 6\' 1"  (1.854 m)  Wt 192 lb 12.8 oz (87.454 kg)   BMI 25.44 kg/m2  SpO2 98%   HENT:  Head: Normocephalic and atraumatic.  Right Ear: Hearing, tympanic membrane, external ear and ear canal normal.  Left Ear: Hearing, tympanic membrane, external ear and ear canal normal.  Nose: Rhinorrhea present. No mucosal edema.  No foreign bodies. Right sinus exhibits no maxillary sinus tenderness and no frontal sinus tenderness. Left sinus exhibits no maxillary sinus tenderness and no frontal sinus tenderness.  Mouth/Throat: Uvula is midline, oropharynx is clear and moist and mucous membranes are normal. No uvula swelling. No oropharyngeal exudate.  Eyes: Conjunctivae and EOM are normal. Pupils are equal, round, and reactive to light. Right eye exhibits no discharge. Left eye exhibits no discharge. No scleral icterus.  Neck: Trachea normal, normal range of motion and full passive range of motion without pain. Neck supple. No thyroid mass and no thyromegaly present.  Cardiovascular: Normal rate, regular rhythm and normal heart sounds.   Pulmonary/Chest: Effort normal and breath sounds normal.  Lymphadenopathy:       Head (right side): No submandibular, no tonsillar, no preauricular, no posterior auricular and no occipital adenopathy present.       Head (left side): No submandibular, no tonsillar, no preauricular and no occipital adenopathy present.    He has no cervical adenopathy.       Right: No supraclavicular adenopathy present.  Left: No supraclavicular adenopathy present.  Neurological: He is alert and oriented to person, place, and time. He has normal strength. No cranial nerve deficit or sensory deficit.  Skin: Skin is warm, dry and intact. No rash noted.  Psychiatric: He has a normal mood and affect. His speech is normal and behavior is normal.           Assessment & Plan:   1. Viral URI with cough Supportive care.  Anticipatory guidance.  RTC if symptoms worsen/persist. - benzonatate (TESSALON) 100 MG capsule; Take 1-2 capsules  (100-200 mg total) by mouth 3 (three) times daily as needed for cough.  Dispense: 40 capsule; Refill: 0   Fara Chute, PA-C Physician Assistant-Certified [facility]

## 2015-05-28 NOTE — Progress Notes (Signed)
  Medical screening examination/treatment/procedure(s) were performed by non-physician practitioner and as supervising physician I was immediately available for consultation/collaboration.     

## 2015-06-13 ENCOUNTER — Ambulatory Visit (INDEPENDENT_AMBULATORY_CARE_PROVIDER_SITE_OTHER): Payer: BLUE CROSS/BLUE SHIELD | Admitting: Physician Assistant

## 2015-06-13 VITALS — BP 128/86 | HR 85 | Temp 97.9°F | Resp 16 | Ht 73.0 in | Wt 190.2 lb

## 2015-06-13 DIAGNOSIS — R21 Rash and other nonspecific skin eruption: Secondary | ICD-10-CM

## 2015-06-13 MED ORDER — VALACYCLOVIR HCL 1 G PO TABS: 1000.0000 mg | ORAL_TABLET | Freq: Two times a day (BID) | ORAL | Status: DC

## 2015-06-13 MED ORDER — TRAMADOL HCL 50 MG PO TABS: 50.0000 mg | ORAL_TABLET | Freq: Four times a day (QID) | ORAL | Status: DC | PRN

## 2015-06-13 NOTE — Progress Notes (Signed)
   Subjective:    Patient ID: Chad Harrison, male    DOB: 04/23/1981, 35 y.o.   MRN: [id number]  Chief Complaint  Patient presents with  . Rash    left buttock and left scrotum    HPI  Presents today with rash on left buttocks and left scrotum x 6 days. Notes symptoms started Tuesday, 1/31/17as a sore feeling. Rash developed Wednesday night, 06/08/15. Described as a red area that look like a collection of skin tags or hives. Associated pain, that patient cannot described. Pain is worse with movement. Rash has not changed in size or shape, but appears to be drying. On 06/07/15 tried Cortisone 10 with no relief. Since 06/08/15, has used antifungal cream with no relief. Last night tried ice and 200mg  ibuprofen with no relief. Associated symptoms include increase in urinary frequency. Denies numbness, tingling, pruritis, burning sensation, discharge of pus, fluid or blood from site of rash. Denies changes in daily routine or changes in personal products. Last sexual encounter was in 2014. Tested positive for Chlamydia in the past.   Review of Systems  As per HPI  Allergies  Allergen Reactions  . Wellbutrin [Bupropion] Hives and Shortness Of Breath  . Codeine Other (See Comments)    Other reaction(s): Other Caused him to be able to walk PARALYZES LEGS   No current home medications  Allergies  Allergen Reactions  . Wellbutrin [Bupropion] Hives and Shortness Of Breath  . Codeine Other (See Comments)    Other reaction(s): Other Caused him to be able to walk PARALYZES LEGS      Objective:   Physical Exam  Constitutional: He appears well-developed and well-nourished.  BP 128/86 mmHg  Pulse 85  Temp(Src) 97.9 F (36.6 C) (Oral)  Resp 16  Ht 6\' 1"  (1.854 m)  Wt 190 lb 3.2 oz (86.274 kg)  BMI 25.10 kg/m2  SpO2 99%   HENT:  Head: Normocephalic and atraumatic.  Abdominal: Hernia confirmed negative in the right inguinal area and confirmed negative in the left inguinal area.    Genitourinary:        Lymphadenopathy:       Right: No inguinal adenopathy present.  Vitals reviewed.     Assessment & Plan:  1. Rash - valACYclovir (VALTREX) 1000 MG tablet; Take 1 tablet (1,000 mg total) by mouth 2 (two) times daily.  Dispense: 20 tablet; Refill: 0 - Varicella-zoster by PCR - HSV(herpes simplex vrs) 1+2 ab-IgG - traMADol (ULTRAM) 50 MG tablet; Take 1-2 tablets (50-100 mg total) by mouth every 6 (six) hours as needed for moderate pain or severe pain.  Dispense: 40 tablet; Refill: 0  Placed order for VSV PCR and HSV titer. Will call patient with results.   Return if symptoms worsen or fail to improve.

## 2015-06-13 NOTE — Progress Notes (Signed)
Patient ID: Chad Harrison, male    DOB: Jan 08, 1981, 35 y.o.   MRN: [id number]  PCP: No PCP Per Patient  Subjective:   Chief Complaint  Patient presents with  . Rash    left buttock and left scrotum    HPI Presents for evaluation of rash on left buttocks and left scrotum x 6 days.   Notes symptoms started Tuesday, 1/31/17as a sore feeling. Rash developed Wednesday night, 06/08/15. Described as a red area that look like a collection of skin tags or hives.   Associated pain, that patient cannot described. Pain is worse with movement. Rash has not changed in size or shape, but appears to be drying.   On 06/07/15 tried Cortisone 10 with no relief. Since 06/08/15, has used antifungal cream with no relief. Last night tried ice and 200mg  ibuprofen with no relief.   Associated symptoms include increase in urinary frequency, but also notes that he is more attentive to little things now and that he may just be noticing what is normal for him.   Denies numbness, tingling, pruritis, burning sensation, discharge of pus, fluid or blood from site of rash. Denies changes in daily routine or changes in personal products. Last sexual encounter was in 2014. Tested positive for Chlamydia in the past.   Review of Systems As above.    Patient Active Problem List   Diagnosis Date Noted  . Plica of knee 0000000     Prior to Admission medications   Medication Sig Start Date End Date Taking? Authorizing Provider  NONE   Allergies  Allergen Reactions  . Wellbutrin [Bupropion] Hives and Shortness Of Breath  . Codeine Other (See Comments)    Other reaction(s): Other Caused him to be able to walk PARALYZES LEGS       Objective:  Physical Exam  Constitutional: He is oriented to person, place, and time. He appears well-developed and well-nourished. He is active and cooperative. No distress.  BP 128/86 mmHg  Pulse 85  Temp(Src) 97.9 F (36.6 C) (Oral)  Resp 16  Ht 6\' 1"  (1.854 m)   Wt 190 lb 3.2 oz (86.274 kg)  BMI 25.10 kg/m2  SpO2 99%   Eyes: Conjunctivae are normal.  Pulmonary/Chest: Effort normal.  Abdominal: Hernia confirmed negative in the right inguinal area and confirmed negative in the left inguinal area.  Genitourinary: Penis normal.    Left testis shows swelling and tenderness. Circumcised.     Lymphadenopathy:       Right: No inguinal adenopathy present.       Left: No inguinal adenopathy present.  Neurological: He is alert and oriented to person, place, and time.  Psychiatric: He has a normal mood and affect. His speech is normal and behavior is normal.           Assessment & Plan:   1. 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 Valtrex. Await titers to determine typing, and considering the possibility of zoster, though location lowers my suspicion of that. Continue the anti-fungal cream on the scrotal area. Tramadol PRN pain. - valACYclovir (VALTREX) 1000 MG tablet; Take 1 tablet (1,000 mg total) by mouth 2 (two) times daily.  Dispense: 20 tablet; Refill: 0 - Varicella-zoster by PCR - HSV(herpes simplex vrs) 1+2 ab-IgG - traMADol (ULTRAM) 50 MG tablet; Take 1-2 tablets (50-100 mg total) by mouth every 6 (six) hours as needed for moderate pain or severe pain.  Dispense: 40 tablet; Refill: 0   Fara Chute, PA-C Physician Assistant-Certified [facility]

## 2015-06-13 NOTE — Patient Instructions (Signed)
Continue using the anti-fungal cream on the scrotum.

## 2015-06-15 LAB — HSV(HERPES SIMPLEX VRS) I + II AB-IGG
HSV 1 GLYCOPROTEIN G AB, IGG: 0.3 IV
HSV 2 Glycoprotein G Ab, IgG: <0.1 IV

## 2015-06-22 ENCOUNTER — Telehealth: Payer: Self-pay

## 2015-06-22 LAB — VARICELLA-ZOSTER BY PCR

## 2015-06-22 NOTE — Telephone Encounter (Signed)
Correct. He does NOT need to refill the Valtrex.

## 2015-06-22 NOTE — Telephone Encounter (Signed)
Please call this patient and let him know what happened at the outside lab.    This doesn't change the treatment, but I recommend that if he has recurrent symptoms, he come in right away so we can collect a culture from the fresh lesions.

## 2015-06-22 NOTE — Telephone Encounter (Signed)
Called and spoke to pt. Explained the situation.   The patient does not need to refill Valtrex once he finishes, correct?

## 2015-06-22 NOTE — Telephone Encounter (Signed)
[facility] called regarding Varicella-zoster by PCR lab. They had an error on their part. The lab did not know they could run the test off the lavender tube sent. By the time they realized; it was too late to run the test.

## 2015-06-24 NOTE — Telephone Encounter (Signed)
Informed pt he does not need to refill Valtrex.

## 2015-10-20 ENCOUNTER — Ambulatory Visit (INDEPENDENT_AMBULATORY_CARE_PROVIDER_SITE_OTHER): Payer: BLUE CROSS/BLUE SHIELD

## 2015-10-20 ENCOUNTER — Ambulatory Visit (INDEPENDENT_AMBULATORY_CARE_PROVIDER_SITE_OTHER): Payer: BLUE CROSS/BLUE SHIELD | Admitting: Family Medicine

## 2015-10-20 VITALS — BP 132/90 | HR 75 | Temp 98.0°F | Resp 14 | Ht 73.0 in | Wt 192.0 lb

## 2015-10-20 DIAGNOSIS — S161XXA Strain of muscle, fascia and tendon at neck level, initial encounter: Secondary | ICD-10-CM
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
## 2015-11-06 ENCOUNTER — Encounter (HOSPITAL_COMMUNITY): Payer: Self-pay | Admitting: Emergency Medicine

## 2015-11-06 ENCOUNTER — Ambulatory Visit (HOSPITAL_COMMUNITY)
Admission: EM | Admit: 2015-11-06 | Discharge: 2015-11-06 | Disposition: A | Discharge status: Home / Self Care | Payer: BLUE CROSS/BLUE SHIELD | Attending: Emergency Medicine | Admitting: Emergency Medicine

## 2015-11-06 DIAGNOSIS — W57XXXA Bitten or stung by nonvenomous insect and other nonvenomous arthropods, initial encounter: Secondary | ICD-10-CM | POA: Diagnosis not present

## 2015-11-06 DIAGNOSIS — S00261A Insect bite (nonvenomous) of right eyelid and periocular area, initial encounter: Secondary | ICD-10-CM

## 2015-11-06 MED ORDER — TETRACAINE HCL 0.5 % OP SOLN
OPHTHALMIC | Status: AC
  Filled 2015-11-06: qty 2

## 2015-11-06 MED ORDER — OLOPATADINE HCL 0.2 % OP SOLN: 1.0000 [drp] | Freq: Every day | OPHTHALMIC | Status: AC

## 2015-11-06 MED ORDER — POLYMYXIN B-TRIMETHOPRIM 10000-0.1 UNIT/ML-% OP SOLN: 1.0000 [drp] | Freq: Four times a day (QID) | OPHTHALMIC | Status: AC

## 2015-11-06 NOTE — ED Notes (Signed)
Patient reports thinking something flew into his right eye.  Since then there has been redness, pain and now a drainage.

## 2015-11-06 NOTE — Discharge Instructions (Signed)
It does look like something bit your eye. Use the Polytrim drops 4 times a day. This is to treat and prevent infection. Use the Pataday drops once a day. This is to help with the inflammation. You should see improvement over the next 2-3 days, but use the drops for 1 week. Follow-up as needed.

## 2015-11-06 NOTE — ED Provider Notes (Addendum)
CSN: [id number]     Arrival date & time 11/06/15  1848 History   First MD Initiated Contact with Patient 11/06/15 1916     Chief Complaint  Patient presents with  . Eye Problem   (Consider location/radiation/quality/duration/timing/severity/associated sxs/prior Treatment) HPI  He is a 35 year old man here for evaluation of right eye pain. He states he was out for a walk yesterday and he felt something fly into his right eye. He had immediate pain. He also quickly developed some redness to the right lateral eye where he felt the object hit. He has had persistent pain and redness. He has also had some drainage. He is concerned that something bit his eye. No change in vision.  Past Medical History  Diagnosis Date  . Loose body of left knee 02/2015  . 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 Slow heartbeat     normal for him, per pt.  . Schatzki's ring     states chokes on food easily  . Jaw clicking    Past Surgical History  Procedure Laterality Date  . Wisdom tooth extraction    . Vasectomy    . Elbow fracture surgery      as a child  . Knee arthroscopy with excision plica Left 0000000    Procedure: ARTHROSCOPY LEFT KNEE WITH EXCISION PLICA;  Surgeon: Frederik Pear, MD;  Location: [facility];  Service: Orthopedics;  Laterality: Left;   Family History  Problem Relation Age of Onset  . Diabetes Mother   . Multiple sclerosis Father   . Anxiety disorder Sister   . Arthritis Maternal Grandmother   . Heart disease Paternal Grandfather   . Autism Son    Social History  Substance Use Topics  . Smoking status: Never Smoker   . Smokeless tobacco: Never Used  . Alcohol Use: 0.0 oz/week    0 Standard drinks or equivalent per week     Comment: rare    Review of Systems As in history of present illness Allergies  Wellbutrin and Codeine  Home Medications   Prior to Admission medications   Medication Sig Start Date End Date Taking? Authorizing Provider   ARIPiprazole (ABILIFY PO) Take by mouth.   Yes Historical Provider, MD  Olopatadine HCl 0.2 % SOLN Apply 1 drop to eye daily. 11/06/15   Melony Overly, MD  trimethoprim-polymyxin b (POLYTRIM) ophthalmic solution Place 1 drop into the right eye 4 (four) times daily. For 1 week 11/06/15   Melony Overly, MD   Meds Ordered and Administered this Visit  Medications - No data to display  BP 132/80 mmHg  Pulse 64  Temp(Src) 97.8 F (36.6 C) (Oral)  Resp 18  SpO2 96% No data found.   Physical Exam  Constitutional: He is oriented to person, place, and time. He appears well-developed and well-nourished. No distress.  Eyes: EOM are normal. Pupils are equal, round, and reactive to light. Lids are everted and swept, no foreign bodies found. Right eye exhibits no discharge and no hordeolum. No foreign body present in the right eye. Left eye exhibits no discharge. Right conjunctiva is injected (limited to the inferior and lateral quadrant). Right conjunctiva has no hemorrhage. Left conjunctiva is not injected. Left conjunctiva has no hemorrhage.  Slit lamp exam:      The right eye shows fluorescein uptake (There is a circular area of uptake in the inferior lateral sclera.). The right eye shows no corneal abrasion and no foreign body.  Cardiovascular: Normal rate.  Pulmonary/Chest: Effort normal.  Neurological: He is alert and oriented to person, place, and time.    ED Course  Procedures (including critical care time)  Labs Review Labs Reviewed - No data to display  Imaging Review No results found.   MDM   1. Insect bite of eye region, right, initial encounter    With the flouroscein uptake, it appears that something did bite his eye. We'll treat with Polytrim to prevent infection as well as Pataday to help with the inflammation. Follow-up as needed.    Melony Overly, MD 11/06/15 [place] Vu Liebman, MD 11/06/15 [id number]

## 2016-02-24 ENCOUNTER — Telehealth: Payer: Self-pay

## 2016-02-24 NOTE — Telephone Encounter (Signed)
Pt would like a CD of all x-rays preformed in our office please! Call when ready for pick up. Thanks!

## 2016-03-16 NOTE — Telephone Encounter (Signed)
[place] secretary:  CD is ready for pick up at [facility].

## 2016-04-10 ENCOUNTER — Telehealth: Payer: Self-pay

## 2016-04-10 NOTE — Telephone Encounter (Signed)
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   [phone]

## 2016-04-10 NOTE — Telephone Encounter (Signed)
Records and bills were faxed to [facility] on 11/25/15.  I refaxed them on 04/10/16

## 2016-10-08 ENCOUNTER — Encounter (HOSPITAL_COMMUNITY): Payer: Self-pay | Admitting: Emergency Medicine

## 2016-10-08 ENCOUNTER — Emergency Department (HOSPITAL_COMMUNITY)
Admission: EM | Admit: 2016-10-08 | Discharge: 2016-10-08 | Disposition: A | Discharge status: Home / Self Care | Payer: BLUE CROSS/BLUE SHIELD | Attending: Emergency Medicine | Admitting: Emergency Medicine

## 2016-10-08 DIAGNOSIS — Z79899 Other long term (current) drug therapy: Secondary | ICD-10-CM | POA: Insufficient documentation

## 2016-10-08 DIAGNOSIS — K439 Ventral hernia without obstruction or gangrene: Secondary | ICD-10-CM

## 2016-10-08 DIAGNOSIS — M6208 Separation of muscle (nontraumatic), other site: Secondary | ICD-10-CM | POA: Insufficient documentation

## 2016-10-08 NOTE — ED Provider Notes (Signed)
[facility] DEPT Provider Note   CSN: [id number] Arrival date & time: 10/08/16  1512  By signing my name below, I, Collene Leyden, attest that this documentation has been prepared under the direction and in the presence of  [address] [name], [employer]. Electronically Signed: Collene Leyden, Scribe. 10/08/16. 3:38 PM.  History   Chief Complaint Chief Complaint  Patient presents with  . Hernia    HPI Comments: Chad Harrison is a 36 y.o. male with a history of a Schatzki's ring and knee problems, who presents to the Emergency Department complaining of years of noticing a "lump" protruding out of the epigastric area of his abdomen whenever he works out or uses his abdominal muscles. He states he's had it for years, even remembers having "a weird abdomen" back in middle school. States he noticed it when he was doing push ups today and it concerned him so he came in for evaluation. States it goes down once he stops using his abs. Denies taking anything for it, denies any pain to the area. Patient denies any pain to the area, fever, chills, CP, SOB, abd pain, N/V/D/C, obstipation, melena, hematochezia, hematuria, dysuria, myalgias, arthralgias, numbness, tingling, focal weakness, or any other complaints at this time. No prior abdominal surgeries.   The history is provided by the patient and medical records. No language interpreter was used.    Past Medical History:  Diagnosis Date  . 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 Jaw clicking   . Loose body of left knee 02/2015  . Schatzki's ring    states chokes on food easily  . Slow heartbeat    normal for him, per pt.    Patient Active Problem List   Diagnosis Date Noted  . Plica of knee 41/74/0814    Past Surgical History:  Procedure Laterality Date  . ELBOW FRACTURE SURGERY     as a child  . KNEE ARTHROSCOPY WITH EXCISION PLICA Left 48/18/5631   Procedure: ARTHROSCOPY LEFT KNEE WITH EXCISION PLICA;  Surgeon: Frederik Pear, MD;   Location: [facility];  Service: Orthopedics;  Laterality: Left;  Marland Kitchen VASECTOMY    . WISDOM TOOTH EXTRACTION         Home Medications    Prior to Admission medications   Medication Sig Start Date End Date Taking? Authorizing Provider  ARIPiprazole (ABILIFY PO) Take by mouth.    [provider]  Olopatadine HCl 0.2 % SOLN Apply 1 drop to eye daily. 11/06/15   Melony Overly, MD  trimethoprim-polymyxin b (POLYTRIM) ophthalmic solution Place 1 drop into the right eye 4 (four) times daily. For 1 week 11/06/15   Melony Overly, MD    Family History Family History  Problem Relation Age of Onset  . Diabetes Mother   . Multiple sclerosis Father   . Anxiety disorder Sister   . Arthritis Maternal Grandmother   . Heart disease Paternal Grandfather   . Autism Son     Social History Social History  Substance Use Topics  . Smoking status: Never Smoker  . Smokeless tobacco: Never Used  . Alcohol use 0.0 oz/week     Comment: rare     Allergies   Wellbutrin [bupropion] and Codeine   Review of Systems Review of Systems  Constitutional: Negative for chills and fever.  Respiratory: Negative for shortness of breath.   Cardiovascular: Negative for chest pain.  Gastrointestinal: Negative for abdominal pain, blood in stool, constipation, diarrhea, nausea and vomiting.       +  Hernia  Genitourinary: Negative for dysuria and hematuria.  Musculoskeletal: Negative for arthralgias and myalgias.  Skin: Negative for color change.  Allergic/Immunologic: Negative for immunocompromised state.  Neurological: Negative for weakness and numbness.  Psychiatric/Behavioral: Negative for confusion.   10 Systems reviewed and all are negative for acute change except as noted in the HPI.   Physical Exam Updated Vital Signs BP 136/81 (BP Location: Left Arm)   Pulse 75   Temp 98.1 F (36.7 C) (Oral)   Resp 16   SpO2 97%   Physical Exam  Constitutional: He is oriented to person,  place, and time. Vital signs are normal. He appears well-developed and well-nourished.  Non-toxic appearance. No distress.  Afebrile, nontoxic, NAD  HENT:  Head: Normocephalic and atraumatic.  Mouth/Throat: Oropharynx is clear and moist and mucous membranes are normal.  Eyes: Conjunctivae and EOM are normal. Right eye exhibits no discharge. Left eye exhibits no discharge.  Neck: Normal range of motion. Neck supple.  Cardiovascular: Normal rate, regular rhythm, normal heart sounds and intact distal pulses.  Exam reveals no gallop and no friction rub.   No murmur heard. Pulmonary/Chest: Effort normal and breath sounds normal. No respiratory distress. He has no decreased breath sounds. He has no wheezes. He has no rhonchi. He has no rales.  Abdominal: Soft. Normal appearance and bowel sounds are normal. He exhibits no distension. There is no tenderness. There is no rigidity, no rebound, no guarding, no CVA tenderness, no tenderness at McBurney's point and negative Murphy's sign. A hernia is present.  Soft, NTND, +BS throughout, no r/g/r, neg murphy's, neg mcburney's, no CVA TTP Small diastasis recti halfway between xyphoid and umbilicus, mild herniation with valsalva which self-reduces after valsalva has stopped.   Musculoskeletal: Normal range of motion.  Neurological: He is alert and oriented to person, place, and time. He has normal strength. No sensory deficit.  Skin: Skin is warm, dry and intact. No rash noted.  Psychiatric: He has a normal mood and affect.  Nursing note and vitals reviewed.    ED Treatments / Results  DIAGNOSTIC STUDIES: Oxygen Saturation is 97% on RA, normal by my interpretation.    COORDINATION OF CARE: 3:37 PM Discussed treatment plan with pt at bedside and pt agreed to plan.  Labs (all labs ordered are listed, but only abnormal results are displayed) Labs Reviewed - No data to display  EKG  EKG Interpretation None       Radiology No results  found.  Procedures Procedures (including critical care time)  Medications Ordered in ED Medications - No data to display   Initial Impression / Assessment and Plan / ED Course  I have reviewed the triage vital signs and the nursing notes.  Pertinent labs & imaging results that were available during my care of the patient were reviewed by me and considered in my medical decision making (see chart for details).     36 y.o. male here for eval of ventral hernia that he has had for many years. He's noticed it bulges out when he uses his abs. On exam, small diastasis recti hernia midway between xyphoid and umbilicus, herniation with valsalva but self-reduces. No abdominal tenderness, adequate bowel sounds. Advised to avoid constipation, high fiber and water intake, and f/up with PCP for ongoing management; if it becomes bothersome, then he could consider seeing a surgeon but at this time, since it's not causing problems, doubt need urgent referral. Doubt need for further emergent work up. I explained the diagnosis and  have given explicit precautions to return to the ER including for any other new or worsening symptoms. The patient understands and accepts the medical plan as it's been dictated and I have answered their questions. Discharge instructions concerning home care and prescriptions have been given. The patient is STABLE and is discharged to home in good condition.   I personally performed the services described in this documentation, which was scribed in my presence. The recorded information has been reviewed and is accurate.   Final Clinical Impressions(s) / ED Diagnoses   Final diagnoses:  Diastasis recti  Ventral hernia without obstruction or gangrene    New Prescriptions New Prescriptions   No medications on [address], [place], [place] 10/08/16 [place], Wenda Overland, MD 10/10/16 [phone]

## 2016-10-08 NOTE — ED Triage Notes (Signed)
Per pt, states when he does sit ups he notices something protruding from his abdomin-no pain, has been going on for awhile

## 2016-10-08 NOTE — Discharge Instructions (Signed)
You have a small hernia of your abdominal wall. Make sure you're staying hydrated, eating plenty of fiber in your diet, and avoiding constipation. Follow up with your regular doctor in 1-2 weeks for recheck and ongoing management of your hernia; they can refer you to a surgeon if your hernia becomes troublesome, but you shouldn't need emergent referral at this time since it's not causing any problems. Return to the ER for emergent changes or worsening symptoms.

## 2017-09-22 ENCOUNTER — Other Ambulatory Visit: Payer: Self-pay

## 2017-09-22 ENCOUNTER — Encounter (HOSPITAL_COMMUNITY): Payer: Self-pay | Admitting: Emergency Medicine

## 2017-09-22 ENCOUNTER — Emergency Department (HOSPITAL_COMMUNITY): Payer: Self-pay

## 2017-09-22 ENCOUNTER — Emergency Department (HOSPITAL_COMMUNITY)
Admission: EM | Admit: 2017-09-22 | Discharge: 2017-09-22 | Disposition: A | Discharge status: Home / Self Care | Payer: Self-pay | Attending: Emergency Medicine | Admitting: Emergency Medicine

## 2017-09-22 DIAGNOSIS — N201 Calculus of ureter: Secondary | ICD-10-CM | POA: Insufficient documentation

## 2017-09-22 DIAGNOSIS — Z79899 Other long term (current) drug therapy: Secondary | ICD-10-CM | POA: Insufficient documentation

## 2017-09-22 LAB — BASIC METABOLIC PANEL
Anion gap: 11 (ref 5–15)
BUN: 18 mg/dL (ref 6–20)
CO2: 24 mmol/L (ref 22–32)
CREATININE: 1 mg/dL (ref 0.61–1.24)
Calcium: 9.4 mg/dL (ref 8.9–10.3)
Chloride: 104 mmol/L (ref 101–111)
Glucose, Bld: 92 mg/dL (ref 65–99)
Potassium: 3.8 mmol/L (ref 3.5–5.1)
SODIUM: 139 mmol/L (ref 135–145)

## 2017-09-22 LAB — CBC WITH DIFFERENTIAL/PLATELET
BASOS PCT: 1 %
Basophils Absolute: 0.1 10*3/uL (ref 0.0–0.1)
EOS PCT: 3 %
Eosinophils Absolute: 0.2 10*3/uL (ref 0.0–0.7)
HCT: 45.3 % (ref 39.0–52.0)
Hemoglobin: 15.6 g/dL (ref 13.0–17.0)
LYMPHS ABS: 1.8 10*3/uL (ref 0.7–4.0)
Lymphocytes Relative: 19 %
MCH: 28 pg (ref 26.0–34.0)
MCHC: 34.4 g/dL (ref 30.0–36.0)
MCV: 81.2 fL (ref 78.0–100.0)
MONOS PCT: 10 %
Monocytes Absolute: 0.9 10*3/uL (ref 0.1–1.0)
NEUTROS ABS: 6.1 10*3/uL (ref 1.7–7.7)
NEUTROS PCT: 67 %
Platelets: 274 10*3/uL (ref 150–400)
RBC: 5.58 MIL/uL (ref 4.22–5.81)
RDW: 13.1 % (ref 11.5–15.5)
WBC: 9.1 10*3/uL (ref 4.0–10.5)

## 2017-09-22 LAB — I-STAT CG4 LACTIC ACID, ED: Lactic Acid, Venous: 0.7 mmol/L (ref 0.5–1.9)

## 2017-09-22 LAB — LIPASE, BLOOD: LIPASE: 31 U/L (ref 11–51)

## 2017-09-22 MED ORDER — SODIUM CHLORIDE 0.9 % IV BOLUS
1000.0000 mL | Freq: Once | INTRAVENOUS | Status: AC
  Administered 2017-09-22: 1000 mL via INTRAVENOUS

## 2017-09-22 MED ORDER — KETOROLAC TROMETHAMINE 30 MG/ML IJ SOLN
30.0000 mg | Freq: Once | INTRAMUSCULAR | Status: AC
  Administered 2017-09-22: 30 mg via INTRAVENOUS
  Filled 2017-09-22: qty 1

## 2017-09-22 MED ORDER — IOPAMIDOL (ISOVUE-300) INJECTION 61%
100.0000 mL | Freq: Once | INTRAVENOUS | Status: AC | PRN
  Administered 2017-09-22: 100 mL via INTRAVENOUS

## 2017-09-22 MED ORDER — IOPAMIDOL (ISOVUE-300) INJECTION 61%
INTRAVENOUS | Status: AC
  Filled 2017-09-22: qty 100

## 2017-09-22 MED ORDER — TAMSULOSIN HCL 0.4 MG PO CAPS: 0.4000 mg | ORAL_CAPSULE | Freq: Every day | ORAL | 0 refills | Status: AC

## 2017-09-22 MED ORDER — HYDROCODONE-ACETAMINOPHEN 5-325 MG PO TABS: 1.0000 | ORAL_TABLET | Freq: Four times a day (QID) | ORAL | 0 refills | Status: AC | PRN

## 2017-09-22 MED ORDER — PROMETHAZINE HCL 25 MG PO TABS: 25.0000 mg | ORAL_TABLET | Freq: Four times a day (QID) | ORAL | 0 refills | Status: AC | PRN

## 2017-09-22 NOTE — ED Triage Notes (Signed)
Pt arriving with right sided abdominal pain that has been present x1 month. Pt also reports having difficulty urinating. Pt states he has 2 hernias as well.

## 2017-09-22 NOTE — Discharge Instructions (Addendum)
Call the urologist provided to schedule a visit.  Return here as needed for any worsening in your condition.  Increase your fluid intake

## 2017-09-22 NOTE — ED Provider Notes (Signed)
[name] DEPT Provider Note   CSN: [id number] Arrival date & time: 09/22/17  0549     History   Chief Complaint Chief Complaint  Patient presents with  . Abdominal Pain    HPI Chad Harrison is a 37 y.o. male.  HPI Patient presents to the emergency department with 2-week history of right-sided abdominal pain.  Patient states he has increasing pain with oral intake.  He states he has sensation where he has to pee and will have a right side pain and vomiting.  The patient states that nothing seems to make the condition better but eating makes the condition worse.  Patient states he did not take any medications prior to arrival for symptoms.  The patient denies chest pain, shortness of breath, headache,blurred vision, neck pain, fever, cough, weakness, numbness, dizziness, anorexia, edema,  diarrhea, rash, back pain, dysuria, hematemesis, bloody stool, near syncope, or syncope. Past Medical History:  Diagnosis Date  . 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 Jaw clicking   . Loose body of left knee 02/2015  . Schatzki's ring    states chokes on food easily  . Slow heartbeat    normal for him, per pt.    Patient Active Problem List   Diagnosis Date Noted  . Plica of knee 67/89/3810    Past Surgical History:  Procedure Laterality Date  . ELBOW FRACTURE SURGERY     as a child  . KNEE ARTHROSCOPY WITH EXCISION PLICA Left 17/51/0258   Procedure: ARTHROSCOPY LEFT KNEE WITH EXCISION PLICA;  Surgeon: Frederik Pear, MD;  Location: [facility];  Service: Orthopedics;  Laterality: Left;  Marland Kitchen VASECTOMY    . WISDOM TOOTH EXTRACTION          Home Medications    Prior to Admission medications   Medication Sig Start Date End Date Taking? Authorizing Provider  Cholecalciferol (VITAMIN D) 2000 units CAPS Take 2,000 Units by mouth daily.   Yes [provider]  guaiFENesin (MUCINEX) 600 MG 12 hr tablet Take 600 mg by mouth 2  (two) times daily as needed for cough.   Yes [provider]  MAGNESIUM-POTASSIUM PO Take 1 tablet by mouth daily.   Yes [provider]  naproxen sodium (ALEVE) 220 MG tablet Take 440 mg by mouth 2 (two) times daily as needed (pain).   Yes [provider]  HYDROcodone-acetaminophen (NORCO/VICODIN) 5-325 MG tablet Take 1 tablet by mouth every 6 (six) hours as needed for moderate pain. 09/22/17   Chantea Surace, Harrell Gave, PA-C  Olopatadine HCl 0.2 % SOLN Apply 1 drop to eye daily. Patient not taking: Reported on 09/22/2017 11/06/15   Melony Overly, MD  promethazine (PHENERGAN) 25 MG tablet Take 1 tablet (25 mg total) by mouth every 6 (six) hours as needed for nausea or vomiting. 09/22/17   Tisa Weisel, Harrell Gave, PA-C  tamsulosin (FLOMAX) 0.4 MG CAPS capsule Take 1 capsule (0.4 mg total) by mouth daily. 09/22/17   Gregroy Dombkowski, Harrell Gave, PA-C  trimethoprim-polymyxin b (POLYTRIM) ophthalmic solution Place 1 drop into the right eye 4 (four) times daily. For 1 week Patient not taking: Reported on 09/22/2017 11/06/15   Melony Overly, MD    Family History Family History  Problem Relation Age of Onset  . Diabetes Mother   . Multiple sclerosis Father   . Anxiety disorder Sister   . Arthritis Maternal Grandmother   . Heart disease Paternal Grandfather   . Autism Son     Social History Social  History   Tobacco Use  . Smoking status: Never Smoker  . Smokeless tobacco: Never Used  Substance Use Topics  . Alcohol use: Yes    Alcohol/week: 0.0 oz    Comment: rare  . Drug use: No     Allergies   Wellbutrin [bupropion] and Codeine   Review of Systems Review of Systems  All other systems negative except as documented in the HPI. All pertinent positives and negatives as reviewed in the HPI. Physical Exam Updated Vital Signs BP (!) 159/93 (BP Location: Left Arm)   Pulse 68   Temp 97.8 F (36.6 C) (Oral)   Resp 18   Ht 5' 11.75" (1.822 m)   Wt 93.9 kg (207 lb)   SpO2 98%    BMI 28.27 kg/m   Physical Exam  Constitutional: He is oriented to person, place, and time. He appears well-developed and well-nourished. No distress.  HENT:  Head: Normocephalic and atraumatic.  Mouth/Throat: Oropharynx is clear and moist.  Eyes: Pupils are equal, round, and reactive to light.  Neck: Normal range of motion. Neck supple.  Cardiovascular: Normal rate, regular rhythm and normal heart sounds. Exam reveals no gallop and no friction rub.  No murmur heard. Pulmonary/Chest: Effort normal and breath sounds normal. No respiratory distress. He has no wheezes.  Abdominal: Soft. Bowel sounds are normal. He exhibits no distension. There is no tenderness. A hernia is present.  Neurological: He is alert and oriented to person, place, and time. He exhibits normal muscle tone. Coordination normal.  Skin: Skin is warm and dry. Capillary refill takes less than 2 seconds. No rash noted. No erythema.  Psychiatric: He has a normal mood and affect. His behavior is normal.  Nursing note and vitals reviewed.    ED Treatments / Results  Labs (all labs ordered are listed, but only abnormal results are displayed) Labs Reviewed  CBC WITH DIFFERENTIAL/PLATELET  BASIC METABOLIC PANEL  LIPASE, BLOOD  I-STAT CG4 LACTIC ACID, ED    EKG None  Radiology Ct Abdomen Pelvis W Contrast  Result Date: 09/22/2017 CLINICAL DATA:  Right-sided abdominal pain 1 month with difficulty urinating. Two previous hernias. EXAM: CT ABDOMEN AND PELVIS WITH CONTRAST TECHNIQUE: Multidetector CT imaging of the abdomen and pelvis was performed using the standard protocol following bolus administration of intravenous contrast. CONTRAST:  160mL ISOVUE-300 IOPAMIDOL (ISOVUE-300) INJECTION 61% COMPARISON:  None. FINDINGS: Lower chest: Normal. Hepatobiliary: Subtle focal fatty infiltration over the central left lobe, otherwise unremarkable liver. Gallbladder and biliary tree are normal. Pancreas: Normal. Spleen: Normal.  Adrenals/Urinary Tract: Adrenal glands are normal. Kidneys are normal in size without hydronephrosis or nephrolithiasis. There is minimal fullness of the right intrarenal collecting system and ureter. There is a 3 mm stone over the distal right ureter just above the UVJ. Left ureter and bladder are normal. Stomach/Bowel: Stomach and small bowel are normal. Appendix is normal. Colon is normal. Vascular/Lymphatic: Normal. Reproductive: Normal. Other: No free fluid or focal inflammatory change. Small umbilical hernia containing only peritoneal fat. Musculoskeletal: Minimal degenerate change of the hips. IMPRESSION: 3 mm stone over the distal right ureter just above the UVJ without significant obstruction. Small umbilical hernia containing only peritoneal fat. Electronically Signed   By: Marin Olp M.D.   On: 09/22/2017 08:35    Procedures Procedures (including critical care time)  Medications Ordered in ED Medications  sodium chloride 0.9 % bolus 1,000 mL (0 mLs Intravenous Stopped 09/22/17 0835)  iopamidol (ISOVUE-300) 61 % injection 100 mL (100 mLs Intravenous Contrast  Given 09/22/17 0815)  ketorolac (TORADOL) 30 MG/ML injection 30 mg (30 mg Intravenous Given 09/22/17 1020)     Initial Impression / Assessment and Plan / ED Course  I have reviewed the triage vital signs and the nursing notes.  Pertinent labs & imaging results that were available during my care of the patient were reviewed by me and considered in my medical decision making (see chart for details).     She will be treated for a ureteral stone.  I have advised close follow-up with urology as important due to the fact that his symptoms have been ongoing for such a long period of time.  Patient agrees with plan and all questions were answered he is stable at this time for discharge.  I did advise the patient on return precautions.  Final Clinical Impressions(s) / ED Diagnoses   Final diagnoses:  Ureteral stone    ED Discharge  Orders        Ordered    tamsulosin (FLOMAX) 0.4 MG CAPS capsule  Daily     09/22/17 1131    promethazine (PHENERGAN) 25 MG tablet  Every 6 hours PRN     09/22/17 1131    HYDROcodone-acetaminophen (NORCO/VICODIN) 5-325 MG tablet  Every 6 hours PRN     09/22/17 [address] PA-C 09/22/17 1639    Carmin Muskrat, MD 09/25/17 1627

## 2017-09-22 NOTE — ED Notes (Signed)
Pt requesting to speak with Gerald Stabs PA again, as he has more questions for him.  Made CHris PA aware.

## 2017-09-22 NOTE — ED Notes (Signed)
Patient transported to CT 

## 2020-05-16 IMAGING — CT CT ABD-PELV W/ CM
2 of 4 series · 16 of 46 positions shown, 18 images · IV contrast (iopamidol)
Comparison: None.

CLINICAL DATA: Right-sided abdominal pain 1 month with difficulty
urinating. Two previous hernias.

EXAM:
CT ABDOMEN AND PELVIS WITH CONTRAST
TECHNIQUE: Multidetector CT imaging of the abdomen and pelvis was performed
using the standard protocol following bolus administration of
intravenous contrast.
CONTRAST:  100mL VWJOWR-922 IOPAMIDOL (VWJOWR-922) INJECTION 61%

[Series 2: axial st · axial · 0.88mm/px · z∈[+1039,+1494]mm · 13 of 105 slices shown, 15 images]
[im 7/105  soft-tissue]
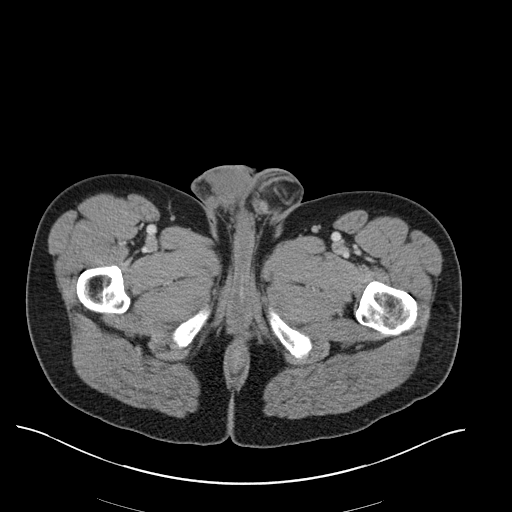
[im 7/105  bone]
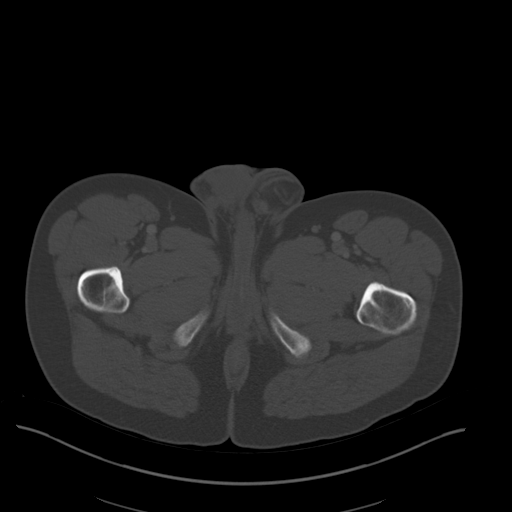
[im 13/105  soft-tissue]
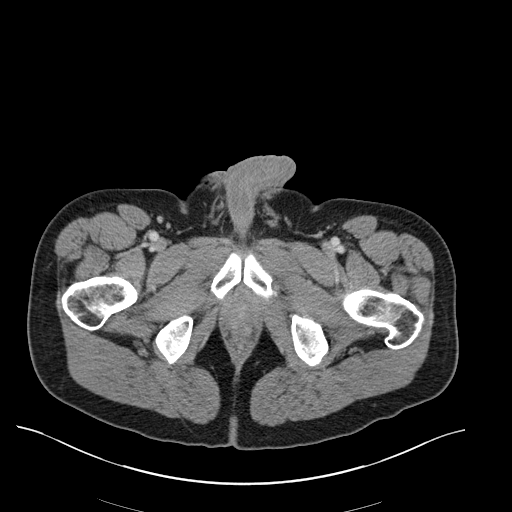
[im 25/105  soft-tissue]
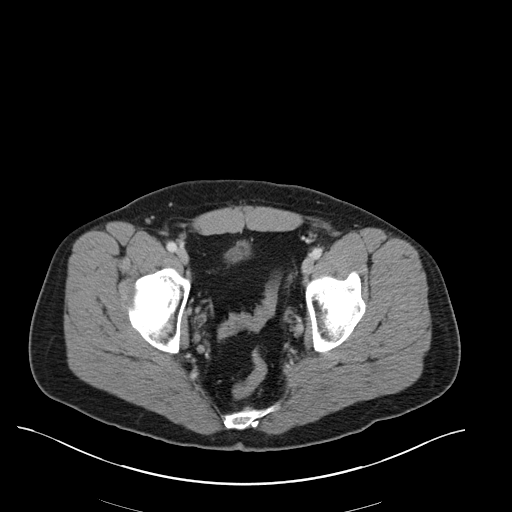
[im 31/105  soft-tissue]
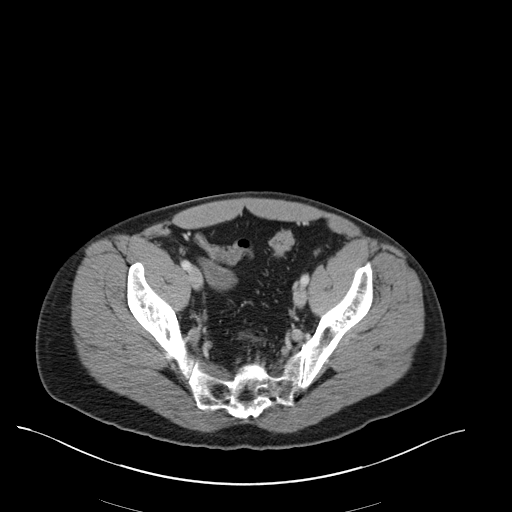
[im 37/105  soft-tissue]
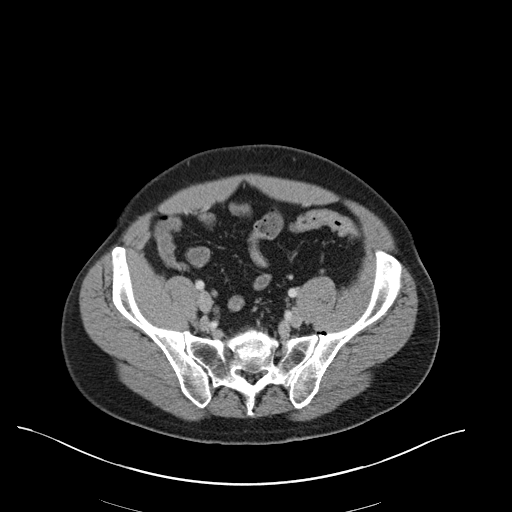
[im 43/105  soft-tissue]
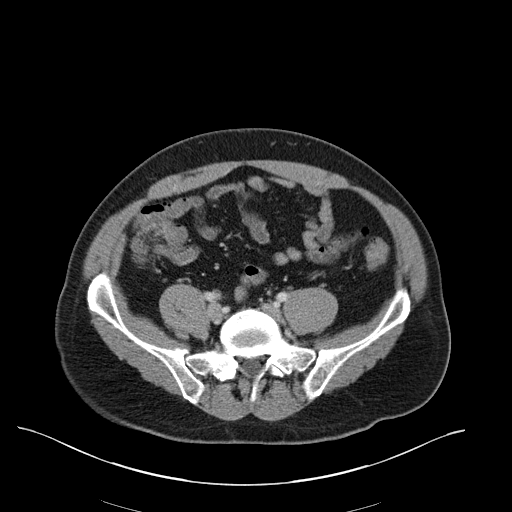
[im 56/105  soft-tissue]
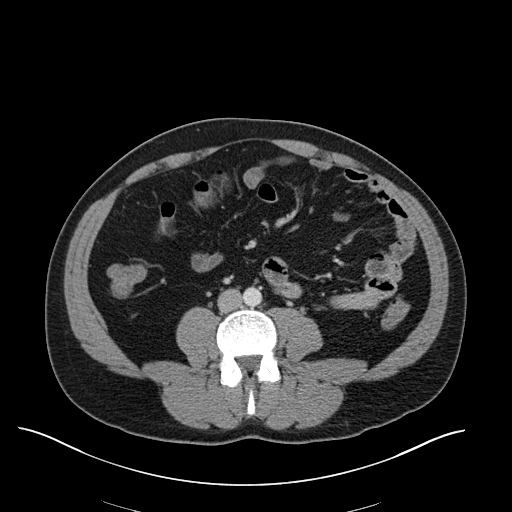
[im 62/105  soft-tissue]
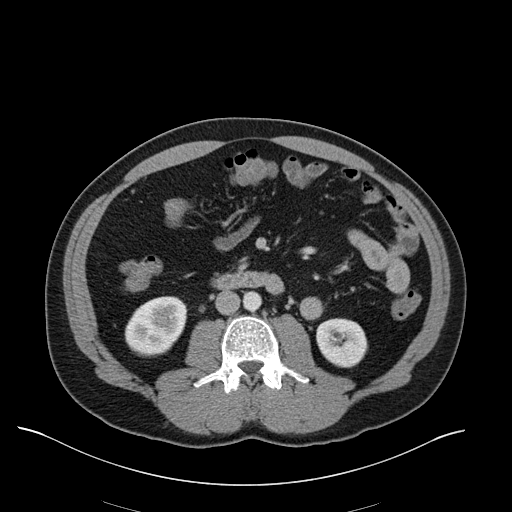
[im 68/105  soft-tissue]
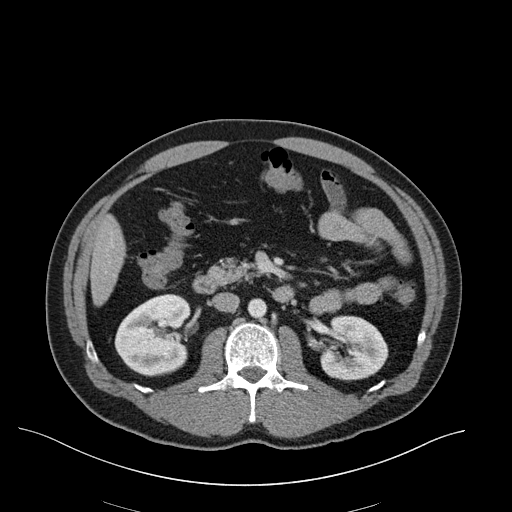
[im 68/105  bone]
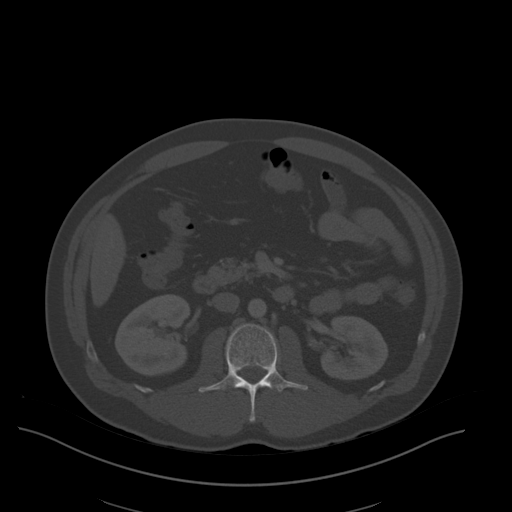
[im 74/105  soft-tissue]
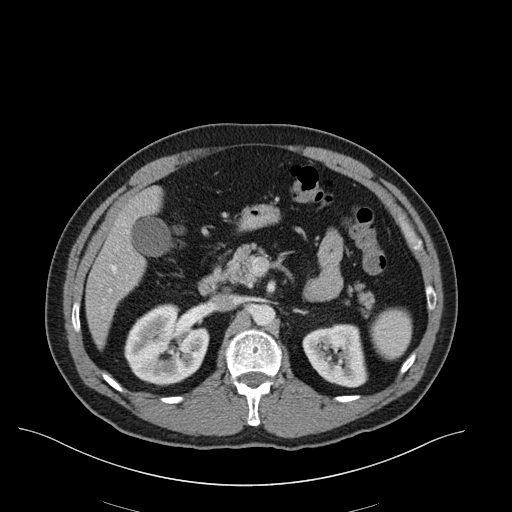
[im 80/105  soft-tissue]
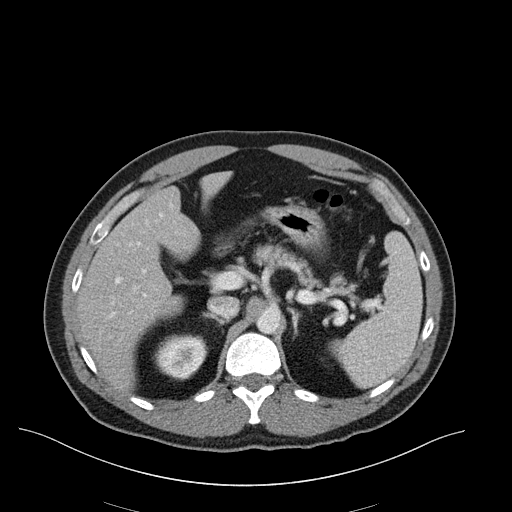
[im 92/105  soft-tissue]
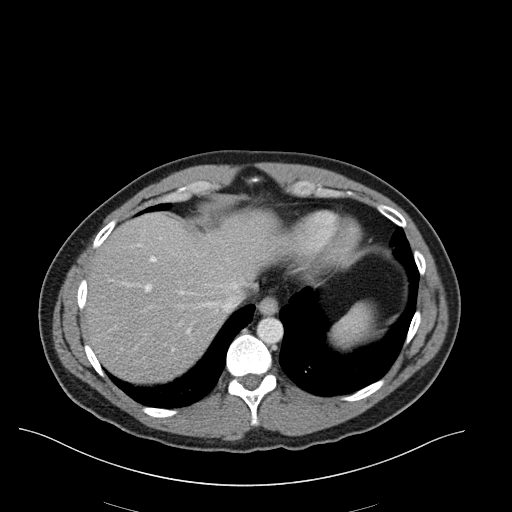
[im 98/105  soft-tissue]
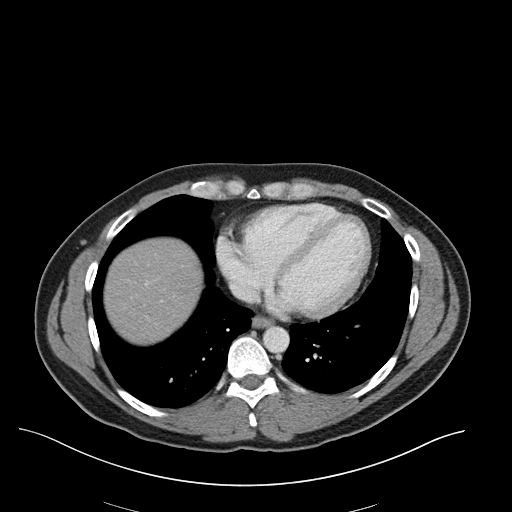

[Series 5: coronal st · coronal · 0.85mm/px · 3 of 101 slices shown]
[im 34/101  soft-tissue]
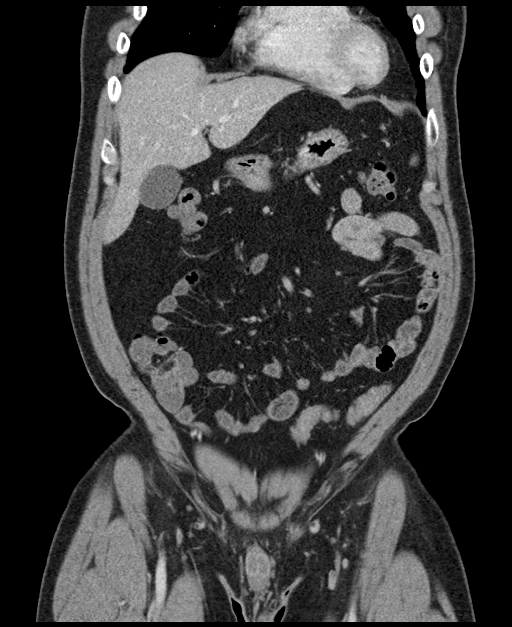
[im 45/101  soft-tissue]
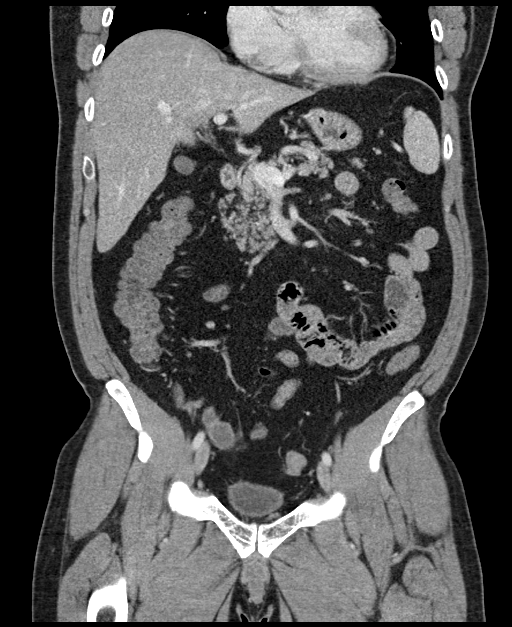
[im 56/101  soft-tissue]
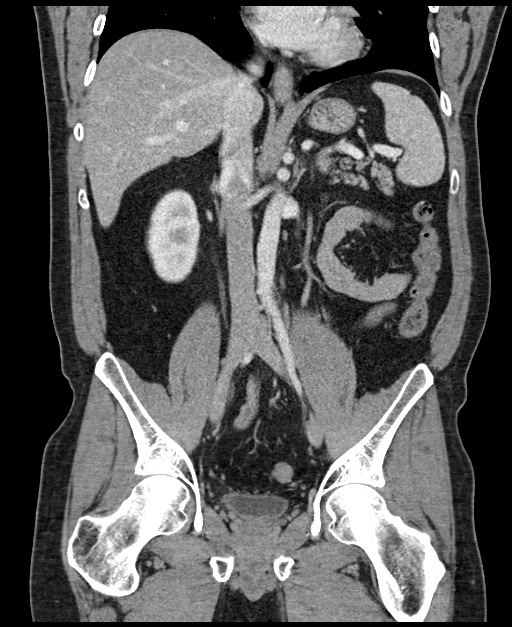

[16 of 46 positions shown; findings below may reference images not displayed]

FINDINGS: Lower chest: Normal.

Hepatobiliary: Subtle focal fatty infiltration over the central left
lobe, otherwise unremarkable liver. Gallbladder and biliary tree are
normal.

Pancreas: Normal.

Spleen: Normal.

Adrenals/Urinary Tract: Adrenal glands are normal. Kidneys are
normal in size without hydronephrosis or nephrolithiasis. There is
minimal fullness of the right intrarenal collecting system and
ureter. There is a 3 mm stone over the distal right ureter just
above the UVJ. Left ureter and bladder are normal.

Stomach/Bowel: Stomach and small bowel are normal. Appendix is
normal. Colon is normal.

Vascular/Lymphatic: Normal.

Reproductive: Normal.

Other: No free fluid or focal inflammatory change. Small umbilical
hernia containing only peritoneal fat.

Musculoskeletal: Minimal degenerate change of the hips.
IMPRESSION: 3 mm stone over the distal right ureter just above the UVJ without
significant obstruction.

Small umbilical hernia containing only peritoneal fat.
